# Patient Record
Sex: Male | Born: 1935 | Race: Black or African American | Hispanic: No | Marital: Married | Smoking: Current some day smoker
Health system: Southern US, Community
[De-identification: ages and names within clinical notes are randomized; demographics above are authoritative.]

## PROBLEM LIST (undated history)

## (undated) DIAGNOSIS — G4700 Insomnia, unspecified: Secondary | ICD-10-CM

## (undated) DIAGNOSIS — D649 Anemia, unspecified: Secondary | ICD-10-CM

## (undated) DIAGNOSIS — I10 Essential (primary) hypertension: Secondary | ICD-10-CM

## (undated) DIAGNOSIS — I639 Cerebral infarction, unspecified: Secondary | ICD-10-CM

## (undated) DIAGNOSIS — I2789 Other specified pulmonary heart diseases: Secondary | ICD-10-CM

## (undated) DIAGNOSIS — I739 Peripheral vascular disease, unspecified: Secondary | ICD-10-CM

## (undated) DIAGNOSIS — I714 Abdominal aortic aneurysm, without rupture, unspecified: Secondary | ICD-10-CM

## (undated) DIAGNOSIS — I723 Aneurysm of iliac artery: Secondary | ICD-10-CM

## (undated) DIAGNOSIS — I2510 Atherosclerotic heart disease of native coronary artery without angina pectoris: Secondary | ICD-10-CM

## (undated) DIAGNOSIS — R791 Abnormal coagulation profile: Secondary | ICD-10-CM

## (undated) DIAGNOSIS — I6529 Occlusion and stenosis of unspecified carotid artery: Secondary | ICD-10-CM

## (undated) DIAGNOSIS — E785 Hyperlipidemia, unspecified: Secondary | ICD-10-CM

## (undated) HISTORY — DX: Peripheral vascular disease, unspecified: I73.9

## (undated) HISTORY — DX: Hyperlipidemia, unspecified: E78.5

## (undated) HISTORY — DX: Cerebral infarction, unspecified: I63.9

## (undated) HISTORY — DX: Aneurysm of iliac artery: I72.3

## (undated) HISTORY — DX: Occlusion and stenosis of unspecified carotid artery: I65.29

## (undated) HISTORY — DX: Abdominal aortic aneurysm, without rupture: I71.4

## (undated) HISTORY — DX: Abdominal aortic aneurysm, without rupture, unspecified: I71.40

## (undated) HISTORY — DX: Abnormal coagulation profile: R79.1

## (undated) HISTORY — DX: Anemia, unspecified: D64.9

## (undated) HISTORY — DX: Insomnia, unspecified: G47.00

## (undated) HISTORY — DX: Other specified pulmonary heart diseases: I27.89

---

## 2009-08-05 ENCOUNTER — Emergency Department (HOSPITAL_BASED_OUTPATIENT_CLINIC_OR_DEPARTMENT_OTHER): Admission: EM | Admit: 2009-08-05 | Discharge: 2009-08-05 | Payer: Self-pay | Admitting: Emergency Medicine

## 2009-08-05 ENCOUNTER — Ambulatory Visit: Payer: Self-pay | Admitting: Radiology

## 2011-03-06 LAB — DIFFERENTIAL
Basophils Absolute: 0.1 10*3/uL (ref 0.0–0.1)
Basophils Relative: 1 % (ref 0–1)
Eosinophils Relative: 2 % (ref 0–5)
Lymphocytes Relative: 48 % — ABNORMAL HIGH (ref 12–46)
Neutro Abs: 1.7 10*3/uL (ref 1.7–7.7)

## 2011-03-06 LAB — CBC
HCT: 41.2 % (ref 39.0–52.0)
MCHC: 33.8 g/dL (ref 30.0–36.0)
Platelets: 327 10*3/uL (ref 150–400)
RDW: 13.8 % (ref 11.5–15.5)

## 2011-03-06 LAB — BASIC METABOLIC PANEL
BUN: 12 mg/dL (ref 6–23)
Calcium: 9.1 mg/dL (ref 8.4–10.5)
GFR calc non Af Amer: >60 mL/min (ref >60)
Glucose, Bld: 77 mg/dL (ref 70–99)

## 2011-03-06 LAB — URINALYSIS, ROUTINE W REFLEX MICROSCOPIC
Bilirubin Urine: NEGATIVE
Ketones, ur: NEGATIVE mg/dL
Nitrite: NEGATIVE
Protein, ur: NEGATIVE mg/dL
Specific Gravity, Urine: 1.012 (ref 1.005–1.030)
Urobilinogen, UA: 1 mg/dL (ref 0.0–1.0)

## 2011-03-06 LAB — SEDIMENTATION RATE: Sed Rate: 1 mm/hr (ref 0–16)

## 2011-06-24 ENCOUNTER — Emergency Department (HOSPITAL_BASED_OUTPATIENT_CLINIC_OR_DEPARTMENT_OTHER)
Admission: EM | Admit: 2011-06-24 | Discharge: 2011-06-24 | Disposition: A | Discharge status: Home / Self Care | Payer: Medicare Other | Attending: Emergency Medicine | Admitting: Emergency Medicine

## 2011-06-24 ENCOUNTER — Encounter: Payer: Self-pay | Admitting: *Deleted

## 2011-06-24 DIAGNOSIS — S61209A Unspecified open wound of unspecified finger without damage to nail, initial encounter: Secondary | ICD-10-CM | POA: Insufficient documentation

## 2011-06-24 DIAGNOSIS — Z8679 Personal history of other diseases of the circulatory system: Secondary | ICD-10-CM | POA: Insufficient documentation

## 2011-06-24 DIAGNOSIS — F17200 Nicotine dependence, unspecified, uncomplicated: Secondary | ICD-10-CM | POA: Insufficient documentation

## 2011-06-24 DIAGNOSIS — I10 Essential (primary) hypertension: Secondary | ICD-10-CM | POA: Insufficient documentation

## 2011-06-24 DIAGNOSIS — W268XXA Contact with other sharp object(s), not elsewhere classified, initial encounter: Secondary | ICD-10-CM | POA: Insufficient documentation

## 2011-06-24 DIAGNOSIS — S61219A Laceration without foreign body of unspecified finger without damage to nail, initial encounter: Secondary | ICD-10-CM

## 2011-06-24 DIAGNOSIS — I2510 Atherosclerotic heart disease of native coronary artery without angina pectoris: Secondary | ICD-10-CM | POA: Insufficient documentation

## 2011-06-24 DIAGNOSIS — Z79899 Other long term (current) drug therapy: Secondary | ICD-10-CM | POA: Insufficient documentation

## 2011-06-24 HISTORY — DX: Essential (primary) hypertension: I10

## 2011-06-24 HISTORY — DX: Atherosclerotic heart disease of native coronary artery without angina pectoris: I25.10

## 2011-06-24 LAB — PROTIME-INR
INR: 1.03 (ref 0.00–1.49)
Prothrombin Time: 13.7 seconds (ref 11.6–15.2)

## 2011-06-24 LAB — DIFFERENTIAL
Basophils Absolute: 0.1 10*3/uL (ref 0.0–0.1)
Basophils Relative: 2 % — ABNORMAL HIGH (ref 0–1)
Eosinophils Absolute: 0.1 10*3/uL (ref 0.0–0.7)
Eosinophils Relative: 2 % (ref 0–5)
Neutrophils Relative %: 32 % — ABNORMAL LOW (ref 43–77)

## 2011-06-24 LAB — CBC
MCH: 24.9 pg — ABNORMAL LOW (ref 26.0–34.0)
MCHC: 34.1 g/dL (ref 30.0–36.0)
MCV: 73 fL — ABNORMAL LOW (ref 78.0–100.0)
Platelets: 425 10*3/uL — ABNORMAL HIGH (ref 150–400)
RBC: 5.07 MIL/uL (ref 4.22–5.81)
RDW: 16.1 % — ABNORMAL HIGH (ref 11.5–15.5)

## 2011-06-24 NOTE — ED Notes (Signed)
Pt c/o laceration to right ring finger this am by metal.

## 2011-06-24 NOTE — ED Provider Notes (Signed)
History     Chief Complaint  Patient presents with  . Laceration   HPI Comments: Lacerated finger on piece of metal this morning.  Has been unable to get it to stop bleeding.  Patient is unsure as to whether or not he is on coumadin.  It is not on his med list but the wife believes he is.    Patient is a 75 y.o. male presenting with skin laceration. The history is provided by the patient and the spouse.  Laceration  The incident occurred 6 to 12 hours ago. The laceration is located on the right hand. The laceration is 1 cm in size. The laceration mechanism was a a metal edge. The pain is at a severity of 0/10. The patient is experiencing no pain. He reports no foreign bodies present.    Past Medical History  Diagnosis Date  . CVA (cerebral infarction)   . Coronary artery disease   . Hypertension     History reviewed. No pertinent past surgical history.  History reviewed. No pertinent family history.  History  Substance Use Topics  . Smoking status: Current Everyday Smoker  . Smokeless tobacco: Not on file  . Alcohol Use: 1.0 oz/week    2 drink(s) per week      Review of Systems  Constitutional: Negative.   Musculoskeletal:       Laceration as above.  Hematological:       Bleeding won't stop.    Physical Exam  BP 176/76  Pulse 70  Temp(Src) 98.7 F (37.1 C) (Oral)  Resp 16  Wt 209 lb (94.802 kg)  SpO2 100%  Physical Exam  Constitutional: He appears well-developed and well-nourished. No distress.  HENT:  Head: Normocephalic and atraumatic.  Musculoskeletal:       The right middle finger is noted to have a 1.5 cm laceration situated length-wise on the dip joint.  There is arterial bleeding noted.    Skin: He is not diaphoretic.    ED Course  LACERATION REPAIR Date/Time: 06/24/2011 6:19 PM Performed by: Geoffery Lyons Authorized by: Geoffery Lyons Consent: Verbal consent obtained. Risks and benefits: risks, benefits and alternatives were discussed Consent  given by: patient Patient understanding: patient states understanding of the procedure being performed Patient consent: the patient's understanding of the procedure matches consent given Procedure consent: procedure consent matches procedure scheduled Relevant documents: relevant documents present and verified Test results: test results available and properly labeled Site marked: the operative site was marked Imaging studies: imaging studies available Required items: required blood products, implants, devices, and special equipment available Patient identity confirmed: verbally with patient Time out: Immediately prior to procedure a "time out" was called to verify the correct patient, procedure, equipment, support staff and site/side marked as required. Location: right middle finger. Laceration length: 1.5 cm Foreign bodies: no foreign bodies Tendon involvement: none Nerve involvement: none Vascular damage: arterial bleeding. Anesthesia: local infiltration Local anesthetic: lidocaine 2% without epinephrine Anesthetic total: 1 ml Patient sedated: yes Preparation: Patient was prepped and draped in the usual sterile fashion. Irrigation solution: saline Amount of cleaning: standard Debridement: none Degree of undermining: none Skin closure: 4-0 Prolene Number of sutures: 3 Technique: simple Approximation: close Approximation difficulty: simple Dressing: antibiotic ointment and tube gauze Patient tolerance: Patient tolerated the procedure well with no immediate complications.    MDM Laceration sutured, coags okay, bleeding controlled.        Riley Lam [facility] 06/24/11 1927

## 2011-07-02 ENCOUNTER — Emergency Department (HOSPITAL_BASED_OUTPATIENT_CLINIC_OR_DEPARTMENT_OTHER)
Admission: EM | Admit: 2011-07-02 | Discharge: 2011-07-02 | Disposition: A | Discharge status: Home / Self Care | Payer: Medicare Other | Attending: Emergency Medicine | Admitting: Emergency Medicine

## 2011-07-02 ENCOUNTER — Encounter (HOSPITAL_BASED_OUTPATIENT_CLINIC_OR_DEPARTMENT_OTHER): Payer: Self-pay | Admitting: *Deleted

## 2011-07-02 DIAGNOSIS — IMO0002 Reserved for concepts with insufficient information to code with codable children: Secondary | ICD-10-CM

## 2011-07-02 DIAGNOSIS — I2510 Atherosclerotic heart disease of native coronary artery without angina pectoris: Secondary | ICD-10-CM | POA: Insufficient documentation

## 2011-07-02 DIAGNOSIS — I10 Essential (primary) hypertension: Secondary | ICD-10-CM | POA: Insufficient documentation

## 2011-07-02 DIAGNOSIS — Z4802 Encounter for removal of sutures: Secondary | ICD-10-CM | POA: Insufficient documentation

## 2011-07-02 DIAGNOSIS — Z8679 Personal history of other diseases of the circulatory system: Secondary | ICD-10-CM | POA: Insufficient documentation

## 2011-07-02 NOTE — ED Provider Notes (Signed)
History     CSN: [id number] Arrival date & time: 07/02/2011 10:00 AM  Chief Complaint  Patient presents with  . Suture / Staple Removal   HPI Comments: Patient is without complaints. He is here for suture removal.  Patient is a 75 y.o. male presenting with suture removal.  Suture / Staple Removal  The sutures were placed 7 to 10 days ago. There has been no treatment since the wound repair. There has been no drainage from the wound. There is no redness present. There is no swelling present. The pain has no pain.    Past Medical History  Diagnosis Date  . CVA (cerebral infarction)   . Coronary artery disease   . Hypertension     History reviewed. No pertinent past surgical history.  No family history on file.  History  Substance Use Topics  . Smoking status: Current Everyday Smoker  . Smokeless tobacco: Not on file  . Alcohol Use: 1.0 oz/week    2 drink(s) per week      Review of Systems  Constitutional: Negative for fever.  Musculoskeletal:       No swelling or pain at the wound site    Physical Exam  BP 137/71  Pulse 68  Temp(Src) 97.9 F (36.6 C) (Oral)  Resp 14  SpO2 99%  Physical Exam  Constitutional: He appears well-developed and well-nourished. No distress.  HENT:  Head: Normocephalic and atraumatic.  Right Ear: External ear normal.  Left Ear: External ear normal.  Eyes: Conjunctivae are normal. Right eye exhibits no discharge. Left eye exhibits no discharge. No scleral icterus.  Neck: Neck supple. No tracheal deviation present.  Pulmonary/Chest: Effort normal. No stridor. No respiratory distress.  Musculoskeletal: He exhibits no edema.       Right ring finger distal aspect 3 sutures noted without erythema or exudate  Neurological: He is alert. Cranial nerve deficit: no gross deficits.  Skin: Skin is warm and dry. No rash noted.  Psychiatric: He has a normal mood and affect.    ED Course  Procedures 3 sutures are removed without  difficulty MDM Suture removal without complication      Celene Kras, MD 07/02/11 1014

## 2011-07-02 NOTE — ED Notes (Signed)
Pt reports received 3-4 stitches to right ring finger x 1 week ago. Needs sutures removed. No redness, drainage, swelling noted.

## 2012-05-11 ENCOUNTER — Encounter: Payer: Self-pay | Admitting: Vascular Surgery

## 2012-05-12 ENCOUNTER — Ambulatory Visit (INDEPENDENT_AMBULATORY_CARE_PROVIDER_SITE_OTHER): Payer: Medicare Other | Admitting: Vascular Surgery

## 2012-05-12 ENCOUNTER — Encounter: Payer: Self-pay | Admitting: Vascular Surgery

## 2012-05-12 VITALS — BP 160/80 | HR 67 | Resp 20 | Ht 68.0 in | Wt 207.0 lb

## 2012-05-12 DIAGNOSIS — I714 Abdominal aortic aneurysm, without rupture, unspecified: Secondary | ICD-10-CM

## 2012-05-12 DIAGNOSIS — I723 Aneurysm of iliac artery: Secondary | ICD-10-CM

## 2012-05-12 NOTE — Progress Notes (Signed)
[facility]  Vascular Consult Note    Patient name: Steven Mejia MRN: [id number] DOB: Sep 25, 1936 Sex: male   Referred by: Kathrynn Speed  Reason for referral: Abdominal and bilateral iliac arteries aneurysm  HISTORY OF PRESENT ILLNESS: The patient is a 76 year old gentleman who was found to have aneurysmal disease by screening ultrasound. He subsequently underwent CT scan for further evaluation and I have his report and reviewed his actual films from his CT scan. He has no symptoms referable to this and does not have any known family history of aneurysmal disease. He does have extensive past medical history as outlined below with specifically history of coronary artery disease hypertension. He does also have a history of mild peripheral vascular occlusive disease his at does have non-insulin-dependent diabetes as well.  Past Medical History  Diagnosis Date  . Coronary artery disease   . Hypertension   . Stroke   . Abnormal coagulation profile   . Insomnia, unspecified   . Other chronic pulmonary heart diseases     Pulmonary hypertension  . Anemia     iron Deficiency  . Peripheral arterial disease   . Diabetes mellitus   . Hyperlipidemia   . Carotid artery occlusion   . AAA (abdominal aortic aneurysm)   . Iliac artery aneurysm, left     No past surgical history on file.  History   Social History  . Marital Status: Married    Spouse Name: N/A    Number of Children: N/A  . Years of Education: N/A   Occupational History  . Not on file.   Social History Main Topics  . Smoking status: Current Everyday Smoker -- 50 years    Types: Cigarettes  . Smokeless tobacco: Never Used  . Alcohol Use: 1.0 oz/week    2 drink(s) per week  . Drug Use: No  . Sexually Active: No   Other Topics Concern  . Not on file   Social History Narrative  . No narrative on file    Family History  Problem Relation Age of Onset  . Heart disease Brother   .  Hyperlipidemia Brother   . Hypertension Brother   . Hypertension Mother   . Heart disease Mother   . Diabetes Mother   . Aneurysm Mother     BRAIN    No Known Allergies  Prior to Admission medications   Medication Sig Start Date End Date Taking? Authorizing Provider  amLODipine (NORVASC) 10 MG tablet Take 10 mg by mouth daily.     Yes Historical Provider, MD  aspirin EC 81 MG tablet Take 81 mg by mouth daily.     Yes Historical Provider, MD  atorvastatin (LIPITOR) 80 MG tablet Take 80 mg by mouth daily.   Yes Historical Provider, MD  carvedilol (COREG) 6.25 MG tablet Take 6.25 mg by mouth 2 (two) times daily with a meal.     Yes Historical Provider, MD  cloNIDine (CATAPRES) 0.2 MG tablet Take 0.2 mg by mouth daily.   Yes Historical Provider, MD  donepezil (ARICEPT) 10 MG tablet Take 10 mg by mouth at bedtime.     Yes Historical Provider, MD  ergocalciferol (VITAMIN D2) 50000 UNITS capsule Take 50,000 Units by mouth once a week. Take on Saturday    Yes Historical Provider, MD  ferrous sulfate 325 (65 FE) MG tablet Take 325 mg by mouth daily with breakfast.     Yes Historical Provider, MD  folic acid (FOLVITE) 1 MG tablet Take  1 mg by mouth daily.     Yes Historical Provider, MD  hydrALAZINE (APRESOLINE) 25 MG tablet Take 25 mg by mouth 3 (three) times daily. Blood pressure    Yes Historical Provider, MD  irbesartan (AVAPRO) 300 MG tablet Take 300 mg by mouth at bedtime.     Yes Historical Provider, MD  prasugrel (EFFIENT) 10 MG TABS Take 10 mg by mouth daily.     Yes Historical Provider, MD  sildenafil (VIAGRA) 100 MG tablet Take 100 mg by mouth as needed.     Yes Historical Provider, MD  naphazoline (CLEAR EYES) 0.012 % ophthalmic solution Place 2 drops into both eyes 2 (two) times daily. Red itchy eyes     Historical Provider, MD     REVIEW OF SYSTEMS: Cardiovascular: No chest pain, chest pressure, palpitations, orthopnea, he does have dyspnea on exertion. No claudication or rest  pain,  No history of DVT or phlebitis. Pulmonary: No productive cough, asthma or wheezing. Neurologic: No weakness, paresthesias, aphasia, or amaurosis. No dizziness. Hematologic: No bleeding problems or clotting disorders. He does report some sensation of increased bleeding and cut. Musculoskeletal: No joint pain or joint swelling. Gastrointestinal: No blood in stool or hematemesis Genitourinary: No dysuria or hematuria. Psychiatric:: No history of major depression. Integumentary: No rashes or ulcers. Constitutional: No fever or chills.  PHYSICAL EXAMINATION:  Filed Vitals:   05/12/12 1000  BP: 160/80  Pulse: 67  Resp: 20    General: The patient appears their stated age. Pulmonary: There is a good air exchange bilaterally without wheezing or rales. Abdomen: Soft and non-tender with normal pitch bowel sounds. Moderate obesity I do not palpate an aneurysm Musculoskeletal: There are no major deformities.  There is no significant extremity pain. Neurologic: No focal weakness or paresthesias are detected, Skin: There are no ulcer or rashes noted. Psychiatric: The patient has normal affect. Cardiovascular: There is a regular rate and rhythm without significant murmur appreciated. Carotids without bruits bilaterally Pulse status 2+ radial 2+ femoral 2+ popliteal pulses. In the outpatient pedal pulses bilaterally    Outside Studies/Documentation Historical records were reviewed.  They showed CT scan reveals a 3.1 cm infrarenal abdominal aortic aneurysm and ectasia of his bilateral iliac arteries. This is 1.8 cm on the right and 2.7 cm on the left  Medication Changes: None  Assessment:  Small abdominal and moderate iliac artery aneurysms  Plan: Had a long discussion with the patient and his wife present. I explained there would be no restrictions as far as activity and the only thing we would require is a standard blood pressure control. He can lift and strain without any  restrictions. I did explain that he has a really no concern regarding his aortic aneurysm but he does have moderate ectasia in aneurysmal change in his iliac arteries. I feel that he is at minimal risk around her currently would not recommend elective repair. We will see him again in 6 months with repeat ultrasound for continued monitoring of this. I did describe symptoms of aneurysm with the patient and his wife and report immediately to emerge from should this occur I explained at this should be very slight risk  Steven Mejia 6/13/201311:18 AM

## 2012-05-12 NOTE — Addendum Note (Signed)
Addended by: Sharee Pimple on: 05/12/2012 12:16 PM   Modules accepted: Orders

## 2012-05-18 ENCOUNTER — Encounter: Payer: Medicare Other | Admitting: Vascular Surgery

## 2012-11-10 ENCOUNTER — Other Ambulatory Visit: Payer: Self-pay | Admitting: *Deleted

## 2012-11-10 ENCOUNTER — Encounter: Payer: Self-pay | Admitting: Neurosurgery

## 2012-11-10 DIAGNOSIS — Z48812 Encounter for surgical aftercare following surgery on the circulatory system: Secondary | ICD-10-CM

## 2012-11-10 DIAGNOSIS — I739 Peripheral vascular disease, unspecified: Secondary | ICD-10-CM

## 2012-11-11 ENCOUNTER — Encounter (INDEPENDENT_AMBULATORY_CARE_PROVIDER_SITE_OTHER): Payer: Medicare Other | Admitting: *Deleted

## 2012-11-11 ENCOUNTER — Encounter: Payer: Self-pay | Admitting: Neurosurgery

## 2012-11-11 ENCOUNTER — Ambulatory Visit (INDEPENDENT_AMBULATORY_CARE_PROVIDER_SITE_OTHER): Payer: Medicare Other | Admitting: Neurosurgery

## 2012-11-11 VITALS — BP 148/82 | HR 68 | Resp 16 | Ht 68.0 in | Wt 205.0 lb

## 2012-11-11 DIAGNOSIS — I714 Abdominal aortic aneurysm, without rupture, unspecified: Secondary | ICD-10-CM | POA: Insufficient documentation

## 2012-11-11 DIAGNOSIS — I723 Aneurysm of iliac artery: Secondary | ICD-10-CM

## 2012-11-11 NOTE — Addendum Note (Signed)
Addended by: Sharee Pimple on: 11/11/2012 03:58 PM   Modules accepted: Orders

## 2012-11-11 NOTE — Progress Notes (Signed)
[facility] [place] AAA/Carotid Office Note  CC: AAA surveillance Referring Physician: Early  History of Present Illness: 76 year old male patient of Dr. Arbie Cookey who followed for small known AAA. The patient denies unusual abdominal or back pain. The patient also denies any new medical diagnoses recent surgery.  Past Medical History  Diagnosis Date  . Coronary artery disease   . Hypertension   . Stroke   . Abnormal coagulation profile   . Insomnia, unspecified   . Other chronic pulmonary heart diseases     Pulmonary hypertension  . Anemia     iron Deficiency  . Peripheral arterial disease   . Hyperlipidemia   . Carotid artery occlusion   . AAA (abdominal aortic aneurysm)   . Iliac artery aneurysm, left     ROS: [x]  Positive   [ ]  Denies    General: [ ]  Weight loss, [ ]  Fever, [ ]  chills Neurologic: [ ]  Dizziness, [ ]  Blackouts, [ ]  Seizure [ ]  Stroke, [ ]  "Mini stroke", [ ]  Slurred speech, [ ]  Temporary blindness; [ ]  weakness in arms or legs, [ ]  Hoarseness Cardiac: [ ]  Chest pain/pressure, [ ]  Shortness of breath at rest [ ]  Shortness of breath with exertion, [ ]  Atrial fibrillation or irregular heartbeat Vascular: [ ]  Pain in legs with walking, [ ]  Pain in legs at rest, [ ]  Pain in legs at night,  [ ]  Non-healing ulcer, [ ]  Blood clot in vein/DVT,   Pulmonary: [ ]  Home oxygen, [ ]  Productive cough, [ ]  Coughing up blood, [ ]  Asthma,  [ ]  Wheezing Musculoskeletal:  [ ]  Arthritis, [ ]  Low back pain, [ ]  Joint pain Hematologic: [ ]  Easy Bruising, [ ]  Anemia; [ ]  Hepatitis Gastrointestinal: [ ]  Blood in stool, [ ]  Gastroesophageal Reflux/heartburn, [ ]  Trouble swallowing Urinary: [ ]  chronic Kidney disease, [ ]  on HD - [ ]  MWF or [ ]  TTHS, [ ]  Burning with urination, [ ]  Difficulty urinating Skin: [ ]  Rashes, [ ]  Wounds Psychological: [ ]  Anxiety, [ ]  Depression   Social History History  Substance Use Topics  . Smoking status: Current Every Day Smoker  -- 50 years    Types: Cigarettes  . Smokeless tobacco: Never Used  . Alcohol Use: 1.0 oz/week    2 drink(s) per week    Family History Family History  Problem Relation Age of Onset  . Heart disease Brother   . Hyperlipidemia Brother   . Hypertension Brother   . Hypertension Mother   . Heart disease Mother   . Diabetes Mother   . Aneurysm Mother     BRAIN    No Known Allergies  Current Outpatient Prescriptions  Medication Sig Dispense Refill  . amLODipine (NORVASC) 10 MG tablet Take 10 mg by mouth daily.        Marland Kitchen aspirin EC 81 MG tablet Take 81 mg by mouth daily.        Marland Kitchen atorvastatin (LIPITOR) 80 MG tablet Take 80 mg by mouth daily.      . carvedilol (COREG) 6.25 MG tablet Take 6.25 mg by mouth 2 (two) times daily with a meal.        . cloNIDine (CATAPRES) 0.2 MG tablet Take 0.2 mg by mouth daily.      Marland Kitchen donepezil (ARICEPT) 10 MG tablet Take 10 mg by mouth at bedtime.        . ergocalciferol (VITAMIN D2) 50000 UNITS capsule Take 50,000 Units by  mouth once a week. Take on Saturday       . ferrous sulfate 325 (65 FE) MG tablet Take 325 mg by mouth daily with breakfast.        . folic acid (FOLVITE) 1 MG tablet Take 1 mg by mouth daily.        . hydrALAZINE (APRESOLINE) 25 MG tablet Take 25 mg by mouth 3 (three) times daily. Blood pressure       . irbesartan (AVAPRO) 300 MG tablet Take 300 mg by mouth at bedtime.        . naphazoline (CLEAR EYES) 0.012 % ophthalmic solution Place 2 drops into both eyes 2 (two) times daily. Red itchy eyes       . prasugrel (EFFIENT) 10 MG TABS Take 10 mg by mouth daily.        . sildenafil (VIAGRA) 100 MG tablet Take 100 mg by mouth as needed.          Physical Examination  Filed Vitals:   11/11/12 0952  BP: 148/82  Pulse: 68  Resp: 16    Body mass index is 31.17 kg/(m^2).  General:  WDWN in NAD Gait: Normal HEENT: WNL Eyes: Pupils equal Pulmonary: normal non-labored breathing , without Rales, rhonchi,  wheezing Cardiac: RRR,  without  Murmurs, rubs or gallops; Abdomen: soft, NT, no masses Skin: no rashes, ulcers noted  Vascular Exam Pulses: 3+ radial pulses bilaterally, there is no pulsatile abdominal mass palpated Carotid bruits: Carotid pulses to auscultation no bruits are heard Extremities without ischemic changes, no Gangrene , no cellulitis; no open wounds;  Musculoskeletal: no muscle wasting or atrophy   Neurologic: A&O X 3; Appropriate Affect ; SENSATION: normal; MOTOR FUNCTION:  moving all extremities equally. Speech is fluent/normal  Non-Invasive Vascular Imaging Maximum AAA measurement per duplex today is 3.2 AP by 3.2 transverse which is virtually unchanged from prior CT. Left common iliac artery measures 2.6 cm x 2.6 cm   ASSESSMENT/PLAN: Asymptomatic patient with small AAA that will followup in one year with repeat duplex. The patient's questions were encouraged and answered, he is in agreement with this plan.  Lauree Chandler ANP   Clinic MD: Early

## 2013-04-22 ENCOUNTER — Encounter (HOSPITAL_BASED_OUTPATIENT_CLINIC_OR_DEPARTMENT_OTHER): Payer: Self-pay | Admitting: Emergency Medicine

## 2013-04-22 ENCOUNTER — Emergency Department (HOSPITAL_BASED_OUTPATIENT_CLINIC_OR_DEPARTMENT_OTHER)
Admission: EM | Admit: 2013-04-22 | Discharge: 2013-04-22 | Disposition: A | Discharge status: Home / Self Care | Payer: Medicare Other | Attending: Emergency Medicine | Admitting: Emergency Medicine

## 2013-04-22 DIAGNOSIS — Z8679 Personal history of other diseases of the circulatory system: Secondary | ICD-10-CM | POA: Insufficient documentation

## 2013-04-22 DIAGNOSIS — Z7982 Long term (current) use of aspirin: Secondary | ICD-10-CM | POA: Insufficient documentation

## 2013-04-22 DIAGNOSIS — E785 Hyperlipidemia, unspecified: Secondary | ICD-10-CM | POA: Insufficient documentation

## 2013-04-22 DIAGNOSIS — W2203XA Walked into furniture, initial encounter: Secondary | ICD-10-CM | POA: Insufficient documentation

## 2013-04-22 DIAGNOSIS — Z23 Encounter for immunization: Secondary | ICD-10-CM | POA: Insufficient documentation

## 2013-04-22 DIAGNOSIS — T148XXA Other injury of unspecified body region, initial encounter: Secondary | ICD-10-CM

## 2013-04-22 DIAGNOSIS — D509 Iron deficiency anemia, unspecified: Secondary | ICD-10-CM | POA: Insufficient documentation

## 2013-04-22 DIAGNOSIS — Z8669 Personal history of other diseases of the nervous system and sense organs: Secondary | ICD-10-CM | POA: Insufficient documentation

## 2013-04-22 DIAGNOSIS — IMO0002 Reserved for concepts with insufficient information to code with codable children: Secondary | ICD-10-CM | POA: Insufficient documentation

## 2013-04-22 DIAGNOSIS — F17200 Nicotine dependence, unspecified, uncomplicated: Secondary | ICD-10-CM | POA: Insufficient documentation

## 2013-04-22 DIAGNOSIS — Y9389 Activity, other specified: Secondary | ICD-10-CM | POA: Insufficient documentation

## 2013-04-22 DIAGNOSIS — Z79899 Other long term (current) drug therapy: Secondary | ICD-10-CM | POA: Insufficient documentation

## 2013-04-22 DIAGNOSIS — I2510 Atherosclerotic heart disease of native coronary artery without angina pectoris: Secondary | ICD-10-CM | POA: Insufficient documentation

## 2013-04-22 DIAGNOSIS — Y9289 Other specified places as the place of occurrence of the external cause: Secondary | ICD-10-CM | POA: Insufficient documentation

## 2013-04-22 DIAGNOSIS — I739 Peripheral vascular disease, unspecified: Secondary | ICD-10-CM | POA: Insufficient documentation

## 2013-04-22 DIAGNOSIS — I10 Essential (primary) hypertension: Secondary | ICD-10-CM | POA: Insufficient documentation

## 2013-04-22 DIAGNOSIS — Z8673 Personal history of transient ischemic attack (TIA), and cerebral infarction without residual deficits: Secondary | ICD-10-CM | POA: Insufficient documentation

## 2013-04-22 MED ORDER — TETANUS-DIPHTH-ACELL PERTUSSIS 5-2.5-18.5 LF-MCG/0.5 IM SUSP
0.5000 mL | Freq: Once | INTRAMUSCULAR | Status: AC
  Administered 2013-04-22: 0.5 mL via INTRAMUSCULAR
  Filled 2013-04-22: qty 0.5

## 2013-04-22 NOTE — ED Provider Notes (Signed)
History     CSN: [id number]  Arrival date & time 04/22/13  4098   First MD Initiated Contact with Patient 04/22/13 0800      Chief Complaint  Patient presents with  . Laceration    (Consider location/radiation/quality/duration/timing/severity/associated sxs/prior treatment) HPI Comments: PT presents with abrasion to right thumb.  This happened yesterday morning.  He states he scratched it on a dresser. He is on Effient and has noted some ongoing oozing from the wound since yesterday. He's not sure when his last tetanus shot was. He complains of very little pain to the area.  Patient is a 77 y.o. male presenting with skin laceration.  Laceration   Past Medical History  Diagnosis Date  . Coronary artery disease   . Hypertension   . Stroke   . Abnormal coagulation profile   . Insomnia, unspecified   . Other chronic pulmonary heart diseases     Pulmonary hypertension  . Anemia     iron Deficiency  . Peripheral arterial disease   . Hyperlipidemia   . Carotid artery occlusion   . AAA (abdominal aortic aneurysm)   . Iliac artery aneurysm, left     History reviewed. No pertinent past surgical history.  Family History  Problem Relation Age of Onset  . Heart disease Brother   . Hyperlipidemia Brother   . Hypertension Brother   . Hypertension Mother   . Heart disease Mother   . Diabetes Mother   . Aneurysm Mother     BRAIN    History  Substance Use Topics  . Smoking status: Current Every Day Smoker -- 50 years    Types: Cigarettes  . Smokeless tobacco: Never Used  . Alcohol Use: 1.0 oz/week    2 drink(s) per week      Review of Systems  Constitutional: Negative for fever.  Gastrointestinal: Negative for nausea and vomiting.  Musculoskeletal: Negative for back pain, joint swelling and arthralgias.  Skin: Positive for wound.  Neurological: Negative for weakness, numbness and headaches.    Allergies  Review of patient's allergies indicates no known  allergies.  Home Medications   Current Outpatient Rx  Name  Route  Sig  Dispense  Refill  . amLODipine (NORVASC) 10 MG tablet   Oral   Take 10 mg by mouth daily.           Marland Kitchen aspirin EC 81 MG tablet   Oral   Take 81 mg by mouth daily.           Marland Kitchen atorvastatin (LIPITOR) 80 MG tablet   Oral   Take 80 mg by mouth daily.         . carvedilol (COREG) 6.25 MG tablet   Oral   Take 6.25 mg by mouth 2 (two) times daily with a meal.           . cloNIDine (CATAPRES) 0.2 MG tablet   Oral   Take 0.2 mg by mouth daily.         Marland Kitchen donepezil (ARICEPT) 10 MG tablet   Oral   Take 10 mg by mouth at bedtime.           . ergocalciferol (VITAMIN D2) 50000 UNITS capsule   Oral   Take 50,000 Units by mouth once a week. Take on Saturday          . ferrous sulfate 325 (65 FE) MG tablet   Oral   Take 325 mg by mouth daily with breakfast.           .  folic acid (FOLVITE) 1 MG tablet   Oral   Take 1 mg by mouth daily.           . hydrALAZINE (APRESOLINE) 25 MG tablet   Oral   Take 25 mg by mouth 3 (three) times daily. Blood pressure          . irbesartan (AVAPRO) 300 MG tablet   Oral   Take 300 mg by mouth at bedtime.           . naphazoline (CLEAR EYES) 0.012 % ophthalmic solution   Both Eyes   Place 2 drops into both eyes 2 (two) times daily. Red itchy eyes          . prasugrel (EFFIENT) 10 MG TABS   Oral   Take 10 mg by mouth daily.           . sildenafil (VIAGRA) 100 MG tablet   Oral   Take 100 mg by mouth as needed.             BP 157/79  Pulse 80  Temp(Src) 98.4 F (36.9 C) (Oral)  Resp 16  Ht 5\' 8"  (1.727 m)  Wt 210 lb (95.255 kg)  BMI 31.94 kg/m2  SpO2 99%  Physical Exam  Constitutional: He is oriented to person, place, and time. He appears well-developed and well-nourished.  HENT:  Head: Normocephalic and atraumatic.  Neck: Normal range of motion. Neck supple.  Cardiovascular: Normal rate.   Pulmonary/Chest: Effort normal.   Musculoskeletal: He exhibits no edema and no tenderness.  Neurological: He is alert and oriented to person, place, and time.  Skin: Skin is warm and dry.  1cm abrasion over right 1st MCP joint.  Mild oozing from sight.  No bony tenderness.  NV intact  Psychiatric: He has a normal mood and affect.    ED Course  Procedures (including critical care time)  Labs Reviewed - No data to display No results found.   1. Abrasion       MDM  A dressing was applied with a hemostatic agent. The area was observed for a period time and there is no ongoing bleeding. Advised patient to leave the dressing on for 24 hours and then to clean the wound and change dressing once a day. I advised him to return here as needed for any ongoing bleeding or signs of infection. His tetanus shot was updated.        Rolan Bucco, MD 04/22/13 [phone]

## 2013-04-22 NOTE — ED Notes (Signed)
Laceration to right thumb occurred yesterday while closing a dresser drawer.  Pt has small area where skin has been removed.

## 2013-10-11 ENCOUNTER — Other Ambulatory Visit: Payer: Self-pay | Admitting: Neurosurgery

## 2013-10-11 DIAGNOSIS — I723 Aneurysm of iliac artery: Secondary | ICD-10-CM

## 2013-10-11 DIAGNOSIS — I714 Abdominal aortic aneurysm, without rupture: Secondary | ICD-10-CM

## 2013-11-09 ENCOUNTER — Encounter: Payer: Self-pay | Admitting: Family

## 2013-11-10 ENCOUNTER — Other Ambulatory Visit (HOSPITAL_COMMUNITY): Payer: Medicare Other

## 2013-11-10 ENCOUNTER — Ambulatory Visit (INDEPENDENT_AMBULATORY_CARE_PROVIDER_SITE_OTHER): Payer: Medicare Other | Admitting: Family

## 2013-11-10 ENCOUNTER — Ambulatory Visit (HOSPITAL_COMMUNITY)
Admission: RE | Admit: 2013-11-10 | Discharge: 2013-11-10 | Disposition: A | Discharge status: Home / Self Care | Payer: Medicare Other | Source: Ambulatory Visit | Attending: Family | Admitting: Family

## 2013-11-10 ENCOUNTER — Encounter: Payer: Self-pay | Admitting: Family

## 2013-11-10 VITALS — BP 130/73 | HR 60 | Resp 14 | Ht 68.0 in | Wt 209.0 lb

## 2013-11-10 DIAGNOSIS — I714 Abdominal aortic aneurysm, without rupture, unspecified: Secondary | ICD-10-CM | POA: Insufficient documentation

## 2013-11-10 DIAGNOSIS — I723 Aneurysm of iliac artery: Secondary | ICD-10-CM | POA: Insufficient documentation

## 2013-11-10 NOTE — Patient Instructions (Addendum)
Abdominal Aortic Aneurysm An aneurysm is a weakened or damaged part of an artery wall that bulges from the normal force of blood pumping through the body. An abdominal aortic aneurysm is an aneurysm that occurs in the lower part of the aorta, the main artery of the body.  The major concern with an abdominal aortic aneurysm is that it can enlarge and burst (rupture) or blood can flow between the layers of the wall of the aorta through a tear (aorticdissection). Both of these conditions can cause bleeding inside the body and can be life threatening unless diagnosed and treated promptly. CAUSES  The exact cause of an abdominal aortic aneurysm is unknown. Some contributing factors are:   A hardening of the arteries caused by the buildup of fat and other substances in the lining of a blood vessel (arteriosclerosis).  Inflammation of the walls of an artery (arteritis).   Connective tissue diseases, such as Marfan syndrome.   Abdominal trauma.   An infection, such as syphilis or staphylococcus, in the wall of the aorta (infectious aortitis) caused by bacteria. RISK FACTORS  Risk factors that contribute to an abdominal aortic aneurysm may include:  Age older than 60 years.   High blood pressure (hypertension).  Male gender.  Ethnicity (white race).  Obesity.  Family history of aneurysm (first degree relatives only).  Tobacco use. PREVENTION  The following healthy lifestyle habits may help decrease your risk of abdominal aortic aneurysm:  Quitting smoking. Smoking can raise your blood pressure and cause arteriosclerosis.  Limiting or avoiding alcohol.  Keeping your blood pressure, blood sugar level, and cholesterol levels within normal limits.  Decreasing your salt intake. In somepeople, too much salt can raise blood pressure and increase your risk of abdominal aortic aneurysm.  Eating a diet low in saturated fats and cholesterol.  Increasing your fiber intake by including  whole grains, vegetables, and fruits in your diet. Eating these foods may help lower blood pressure.  Maintaining a healthy weight.  Staying physically active and exercising regularly. SYMPTOMS  The symptoms of abdominal aortic aneurysm may vary depending on the size and rate of growth of the aneurysm.Most grow slowly and do not have any symptoms. When symptoms do occur, they may include:  Pain (abdomen, side, lower back, or groin). The pain may vary in intensity. A sudden onset of severe pain may indicate that the aneurysm has ruptured.  Feeling full after eating only small amounts of food.  Nausea or vomiting or both.  Feeling a pulsating lump in the abdomen.  Feeling faint or passing out. DIAGNOSIS  Since most unruptured abdominal aortic aneurysms have no symptoms, they are often discovered during diagnostic exams for other conditions. An aneurysm may be found during the following procedures:  Ultrasonography (A one-time screening for abdominal aortic aneurysm by ultrasonography is also recommended for all men aged 65-75 years who have ever smoked).  X-ray exams.  A computed tomography (CT).  Magnetic resonance imaging (MRI).  Angiography or arteriography. TREATMENT  Treatment of an abdominal aortic aneurysm depends on the size of your aneurysm, your age, and risk factors for rupture. Medication to control blood pressure and pain may be used to manage aneurysms smaller than 6 cm. Regular monitoring for enlargement may be recommended by your caregiver if:  The aneurysm is 3 4 cm in size (an annual ultrasonography may be recommended).  The aneurysm is 4 4.5 cm in size (an ultrasonography every 6 months may be recommended).  The aneurysm is larger than 4.5   cm in size (your caregiver may ask that you be examined by a vascular surgeon). If your aneurysm is larger than 6 cm, surgical repair may be recommended. There are two main methods for repair of an aneurysm:   Endovascular  repair (a minimally invasive surgery). This is done most often.  Open repair. This method is used if an endovascular repair is not possible. Document Released: 08/26/2005 Document Revised: 03/13/2013 Document Reviewed: 12/16/2012 Exit[employer] Patient Information 2014 [employer].   Smoking Cessation Quitting smoking is important to your health and has many advantages. However, it is not always easy to quit since nicotine is a very addictive drug. Often times, people try 3 times or more before being able to quit. This document explains the best ways for you to prepare to quit smoking. Quitting takes hard work and a lot of effort, but you can do it. ADVANTAGES OF QUITTING SMOKING  You will live longer, feel better, and live better.  Your body will feel the impact of quitting smoking almost immediately.  Within 20 minutes, blood pressure decreases. Your pulse returns to its normal level.  After 8 hours, carbon monoxide levels in the blood return to normal. Your oxygen level increases.  After 24 hours, the chance of having a heart attack starts to decrease. Your breath, hair, and body stop smelling like smoke.  After 48 hours, damaged nerve endings begin to recover. Your sense of taste and smell improve.  After 72 hours, the body is virtually free of nicotine. Your bronchial tubes relax and breathing becomes easier.  After 2 to 12 weeks, lungs can hold more air. Exercise becomes easier and circulation improves.  The risk of having a heart attack, stroke, cancer, or lung disease is greatly reduced.  After 1 year, the risk of coronary heart disease is cut in half.  After 5 years, the risk of stroke falls to the same as a nonsmoker.  After 10 years, the risk of lung cancer is cut in half and the risk of other cancers decreases significantly.  After 15 years, the risk of coronary heart disease drops, usually to the level of a nonsmoker.  If you are pregnant, quitting smoking will improve  your chances of having a healthy baby.  The people you live with, especially any children, will be healthier.  You will have extra money to spend on things other than cigarettes. QUESTIONS TO THINK ABOUT BEFORE ATTEMPTING TO QUIT You may want to talk about your answers with your caregiver.  Why do you want to quit?  If you tried to quit in the past, what helped and what did not?  What will be the most difficult situations for you after you quit? How will you plan to handle them?  Who can help you through the tough times? Your family? Friends? A caregiver?  What pleasures do you get from smoking? What ways can you still get pleasure if you quit? Here are some questions to ask your caregiver:  How can you help me to be successful at quitting?  What medicine do you think would be best for me and how should I take it?  What should I do if I need more help?  What is smoking withdrawal like? How can I get information on withdrawal? GET READY  Set a quit date.  Change your environment by getting rid of all cigarettes, ashtrays, matches, and lighters in your home, car, or work. Do not let people smoke in your home.  Review your   past attempts to quit. Think about what worked and what did not. GET SUPPORT AND ENCOURAGEMENT You have a better chance of being successful if you have help. You can get support in many ways.  Tell your family, friends, and co-workers that you are going to quit and need their support. Ask them not to smoke around you.  Get individual, group, or telephone counseling and support. Programs are available at local hospitals and health centers. Call your local health department for information about programs in your area.  Spiritual beliefs and practices may help some smokers quit.  Download a "quit meter" on your computer to keep track of quit statistics, such as how long you have gone without smoking, cigarettes not smoked, and money saved.  Get a self-help  book about quitting smoking and staying off of tobacco. LEARN NEW SKILLS AND BEHAVIORS  Distract yourself from urges to smoke. Talk to someone, go for a walk, or occupy your time with a task.  Change your normal routine. Take a different route to work. Drink tea instead of coffee. Eat breakfast in a different place.  Reduce your stress. Take a hot bath, exercise, or read a book.  Plan something enjoyable to do every day. Reward yourself for not smoking.  Explore interactive web-based programs that specialize in helping you quit. GET MEDICINE AND USE IT CORRECTLY Medicines can help you stop smoking and decrease the urge to smoke. Combining medicine with the above behavioral methods and support can greatly increase your chances of successfully quitting smoking.  Nicotine replacement therapy helps deliver nicotine to your body without the negative effects and risks of smoking. Nicotine replacement therapy includes nicotine gum, lozenges, inhalers, nasal sprays, and skin patches. Some may be available over-the-counter and others require a prescription.  Antidepressant medicine helps people abstain from smoking, but how this works is unknown. This medicine is available by prescription.  Nicotinic receptor partial agonist medicine simulates the effect of nicotine in your brain. This medicine is available by prescription. Ask your caregiver for advice about which medicines to use and how to use them based on your health history. Your caregiver will tell you what side effects to look out for if you choose to be on a medicine or therapy. Carefully read the information on the package. Do not use any other product containing nicotine while using a nicotine replacement product.  RELAPSE OR DIFFICULT SITUATIONS Most relapses occur within the first 3 months after quitting. Do not be discouraged if you start smoking again. Remember, most people try several times before finally quitting. You may have symptoms  of withdrawal because your body is used to nicotine. You may crave cigarettes, be irritable, feel very hungry, cough often, get headaches, or have difficulty concentrating. The withdrawal symptoms are only temporary. They are strongest when you first quit, but they will go away within 10 14 days. To reduce the chances of relapse, try to:  Avoid drinking alcohol. Drinking lowers your chances of successfully quitting.  Reduce the amount of caffeine you consume. Once you quit smoking, the amount of caffeine in your body increases and can give you symptoms, such as a rapid heartbeat, sweating, and anxiety.  Avoid smokers because they can make you want to smoke.  Do not let weight gain distract you. Many smokers will gain weight when they quit, usually less than 10 pounds. Eat a healthy diet and stay active. You can always lose the weight gained after you quit.  Find ways to improve your   mood other than smoking. FOR MORE INFORMATION  www.smokefree.gov  Document Released: 11/10/2001 Document Revised: 05/17/2012 Document Reviewed: 02/25/2012 [employer] Patient Information 2014 [employer].  

## 2013-11-10 NOTE — Progress Notes (Signed)
[facility] [place]  Established Abdominal Aortic Aneurysm  History of Present Illness  Steven Mejia is a 77 y.o. (03/12/1936) male patient of Dr. Arbie Cookey who is followed for small known AAA. He presents with chief complaint: follow up for AAA.   Previous studies demonstrate an AAA, measuring 3.2 x 3.2 cm; at that time the left common iliac artery measured 2.6 cm x 2.6 cm. The patient does not have back or abdominal pain.    The patient reports claudication in both calves after walking a mile, relieved by rest, denies non-healing wounds.  He was started on Effient after his stroke about 3 years ago as manifested by left leg and arm numbness and weakness, slurred speech, confusion, denies monocular blindness; he was treated in an ED at that time with thrombolytic medication. He denies any further stroke or TIA symptoms. He states that his cardiologist is checking [place] of his neck and legs.  He denies history of carotid artery intervention. He denies steal symptoms in either arm.  He admits to more than 14 drinks/week.  Pt Diabetic: No Pt smoker: smoker  (1 cigarette/day x 60 yrs)  Past Medical History  Diagnosis Date  . Coronary artery disease   . Hypertension   . Stroke   . Abnormal coagulation profile   . Insomnia, unspecified   . Other chronic pulmonary heart diseases     Pulmonary hypertension  . Anemia     iron Deficiency  . Peripheral arterial disease   . Hyperlipidemia   . Carotid artery occlusion   . AAA (abdominal aortic aneurysm)   . Iliac artery aneurysm, left    No past surgical history on file. Social History History   Social History  . Marital Status: Married    Spouse Name: N/A    Number of Children: N/A  . Years of Education: N/A   Occupational History  . Not on file.   Social History Main Topics  . Smoking status: Current Every Day Smoker -- 50 years    Types: Cigarettes  . Smokeless tobacco: Never Used  . Alcohol Use: 1.0  oz/week    2 drink(s) per week  . Drug Use: No  . Sexual Activity: No   Other Topics Concern  . Not on file   Social History Narrative  . No narrative on file   Family History Family History  Problem Relation Age of Onset  . Heart disease Brother   . Hyperlipidemia Brother   . Hypertension Brother   . Hypertension Mother   . Heart disease Mother   . Diabetes Mother   . Aneurysm Mother     BRAIN    Current Outpatient Prescriptions on File Prior to Visit  Medication Sig Dispense Refill  . amLODipine (NORVASC) 10 MG tablet Take 10 mg by mouth daily.        Marland Kitchen aspirin EC 81 MG tablet Take 81 mg by mouth daily.        Marland Kitchen atorvastatin (LIPITOR) 80 MG tablet Take 80 mg by mouth daily.      . carvedilol (COREG) 6.25 MG tablet Take 6.25 mg by mouth 2 (two) times daily with a meal.        . cloNIDine (CATAPRES) 0.2 MG tablet Take 0.2 mg by mouth daily.      Marland Kitchen donepezil (ARICEPT) 10 MG tablet Take 10 mg by mouth at bedtime.        . ergocalciferol (VITAMIN D2) 50000 UNITS capsule Take 50,000 Units  by mouth once a week. Take on Saturday       . ferrous sulfate 325 (65 FE) MG tablet Take 325 mg by mouth daily with breakfast.        . folic acid (FOLVITE) 1 MG tablet Take 1 mg by mouth daily.        . hydrALAZINE (APRESOLINE) 25 MG tablet Take 25 mg by mouth 3 (three) times daily. Blood pressure       . irbesartan (AVAPRO) 300 MG tablet Take 300 mg by mouth at bedtime.        . naphazoline (CLEAR EYES) 0.012 % ophthalmic solution Place 2 drops into both eyes 2 (two) times daily. Red itchy eyes       . prasugrel (EFFIENT) 10 MG TABS Take 10 mg by mouth daily.        . sildenafil (VIAGRA) 100 MG tablet Take 100 mg by mouth as needed.         No current facility-administered medications on file prior to visit.   No Known Allergies  ROS: See HPI for pertinent positives and negatives.   Physical Examination  Filed Vitals:   11/10/13 1025  BP: 130/73  Pulse: 60  Resp: 14   Filed  Weights   11/10/13 1025  Weight: 209 lb (94.802 kg)   Body mass index is 31.79 kg/(m^2).  General: A&O x 3, WD, Obese.  Pulmonary: Sym exp, good air movt, CTAB, no rales, rhonchi, or wheezing.  Cardiac: RRR, Nl S1, S2.  Carotid Bruits Left Right   Negative Negative   Radial pulses: right is 2+, left is not palpable, left brachial pulse is 3+                          VASCULAR EXAM:                                                                                                         LE Pulses LEFT RIGHT       POPLITEAL  not palpable   not palpable     Gastrointestinal: soft, NTND, -G/R, - HSM, - masses, - CVAT B.  Musculoskeletal: M/S 5/5 throughout.  Neurologic: CN 2-12 intact, Pain and light touch intact in extremities, Motor exam as listed above.  Non-Invasive Vascular Imaging  AAA Duplex (11/10/2013)  Previous size: 3.2 cm (Date: 11/11/2012)  Current size:  3.3 cm (Date: 11/10/2013)  Medical Decision Making  The patient is a 77 y.o. male who presents with asymptomatic small AAA with no increase in size.  His modifiable  risk factors for aneurysmal growth are obesity, smoking, excessive ETOH consumption, dietary indiscretions, and inactivity; this was discussed with him and his wife.  Based on this patient's exam and diagnostic studies, the patient will follow up in 1 year  with the following studies: AAA.  The threshold for repair is AAA size > 5.5 cm, growth > 1 cm/yr, and symptomatic status.  I emphasized the importance of maximal medical management including strict control of blood pressure, blood  glucose, and lipid levels, antiplatelet agents, obtaining regular exercise, and cessation of smoking.   The patient was given information about AAA including signs, symptoms, treatment, and how to minimize the risk of enlargement and rupture of aneurysms.    The patient was advised to call 911 should the patient experience sudden onset abdominal or back pain.    Thank you for allowing [place] to participate in this patient's care.  Charisse March, RN, MSN, FNP-C [facility] [place] Office: [phone]  Clinic Physician: Imogene Burn  11/10/2013, 9:08 AM

## 2013-11-14 ENCOUNTER — Other Ambulatory Visit: Payer: Medicare Other

## 2013-11-14 ENCOUNTER — Ambulatory Visit: Payer: Medicare Other | Admitting: Neurosurgery

## 2014-11-10 ENCOUNTER — Emergency Department (HOSPITAL_BASED_OUTPATIENT_CLINIC_OR_DEPARTMENT_OTHER)
Admission: EM | Admit: 2014-11-10 | Discharge: 2014-11-10 | Disposition: A | Discharge status: Home / Self Care | Payer: Medicare Other | Attending: Emergency Medicine | Admitting: Emergency Medicine

## 2014-11-10 ENCOUNTER — Encounter (HOSPITAL_BASED_OUTPATIENT_CLINIC_OR_DEPARTMENT_OTHER): Payer: Self-pay

## 2014-11-10 ENCOUNTER — Emergency Department (HOSPITAL_BASED_OUTPATIENT_CLINIC_OR_DEPARTMENT_OTHER): Payer: Medicare Other

## 2014-11-10 DIAGNOSIS — Z7982 Long term (current) use of aspirin: Secondary | ICD-10-CM | POA: Diagnosis not present

## 2014-11-10 DIAGNOSIS — I10 Essential (primary) hypertension: Secondary | ICD-10-CM | POA: Insufficient documentation

## 2014-11-10 DIAGNOSIS — Z79899 Other long term (current) drug therapy: Secondary | ICD-10-CM | POA: Diagnosis not present

## 2014-11-10 DIAGNOSIS — E785 Hyperlipidemia, unspecified: Secondary | ICD-10-CM | POA: Diagnosis not present

## 2014-11-10 DIAGNOSIS — I739 Peripheral vascular disease, unspecified: Secondary | ICD-10-CM | POA: Diagnosis not present

## 2014-11-10 DIAGNOSIS — D529 Folate deficiency anemia, unspecified: Secondary | ICD-10-CM | POA: Diagnosis not present

## 2014-11-10 DIAGNOSIS — Z7902 Long term (current) use of antithrombotics/antiplatelets: Secondary | ICD-10-CM | POA: Insufficient documentation

## 2014-11-10 DIAGNOSIS — G4700 Insomnia, unspecified: Secondary | ICD-10-CM | POA: Insufficient documentation

## 2014-11-10 DIAGNOSIS — Z8673 Personal history of transient ischemic attack (TIA), and cerebral infarction without residual deficits: Secondary | ICD-10-CM | POA: Diagnosis not present

## 2014-11-10 DIAGNOSIS — I2510 Atherosclerotic heart disease of native coronary artery without angina pectoris: Secondary | ICD-10-CM | POA: Diagnosis not present

## 2014-11-10 DIAGNOSIS — M5416 Radiculopathy, lumbar region: Secondary | ICD-10-CM | POA: Diagnosis not present

## 2014-11-10 DIAGNOSIS — M545 Low back pain: Secondary | ICD-10-CM | POA: Diagnosis present

## 2014-11-10 DIAGNOSIS — M549 Dorsalgia, unspecified: Secondary | ICD-10-CM

## 2014-11-10 DIAGNOSIS — Z720 Tobacco use: Secondary | ICD-10-CM | POA: Insufficient documentation

## 2014-11-10 MED ORDER — HYDROCODONE-ACETAMINOPHEN 5-325 MG PO TABS
2.0000 | ORAL_TABLET | Freq: Once | ORAL | Status: AC
  Administered 2014-11-10: 2 via ORAL
  Filled 2014-11-10: qty 2

## 2014-11-10 MED ORDER — HYDROCODONE-ACETAMINOPHEN 5-325 MG PO TABS: 1.0000 | ORAL_TABLET | ORAL | Status: AC | PRN

## 2014-11-10 MED ORDER — METHYLPREDNISOLONE 4 MG PO KIT: PACK | ORAL | Status: AC

## 2014-11-10 NOTE — Discharge Instructions (Signed)
Rest for the next few days on your back, with your hips and knees bent. Follow-up with your physician at your scheduled appointment on Tuesday. You develop sudden loss of bowel or bladder control or the inability to empty her bowel or bladder, or sudden weakness of the right leg, or symptoms to the left leg, then return to the emergency room. This would indicate a sudden acute herniation of a disc  Back Pain, Adult Low back pain is very common. About 1 in 5 people have back pain.The cause of low back pain is rarely dangerous. The pain often gets better over time.About half of people with a sudden onset of back pain feel better in just 2 weeks. About 8 in 10 people feel better by 6 weeks.  CAUSES Some common causes of back pain include:  Strain of the muscles or ligaments supporting the spine.  Wear and tear (degeneration) of the spinal discs.  Arthritis.  Direct injury to the back. DIAGNOSIS Most of the time, the direct cause of low back pain is not known.However, back pain can be treated effectively even when the exact cause of the pain is unknown.Answering your caregiver's questions about your overall health and symptoms is one of the most accurate ways to make sure the cause of your pain is not dangerous. If your caregiver needs more information, he or she may order lab work or imaging tests (X-rays or MRIs).However, even if imaging tests show changes in your back, this usually does not require surgery. HOME CARE INSTRUCTIONS For many people, back pain returns.Since low back pain is rarely dangerous, it is often a condition that people can learn to [facility] their own.   Remain active. It is stressful on the back to sit or stand in one place. Do not sit, drive, or stand in one place for more than 30 minutes at a time. Take short walks on level surfaces as soon as pain allows.Try to increase the length of time you walk each day.  Do not stay in bed.Resting more than 1 or 2 days can  delay your recovery.  Do not avoid exercise or work.Your body is made to move.It is not dangerous to be active, even though your back may hurt.Your back will likely heal faster if you return to being active before your pain is gone.  Pay attention to your body when you bend and lift. Many people have less discomfortwhen lifting if they bend their knees, keep the load close to their bodies,and avoid twisting. Often, the most comfortable positions are those that put less stress on your recovering back.  Find a comfortable position to sleep. Use a firm mattress and lie on your side with your knees slightly bent. If you lie on your back, put a pillow under your knees.  Only take over-the-counter or prescription medicines as directed by your caregiver. Over-the-counter medicines to reduce pain and inflammation are often the most helpful.Your caregiver may prescribe muscle relaxant drugs.These medicines help dull your pain so you can more quickly return to your normal activities and healthy exercise.  Put ice on the injured area.  Put ice in a plastic bag.  Place a towel between your skin and the bag.  Leave the ice on for 15-20 minutes, 03-04 times a day for the first 2 to 3 days. After that, ice and heat may be alternated to reduce pain and spasms.  Ask your caregiver about trying back exercises and gentle massage. This may be of some benefit.  Avoid feeling anxious or stressed.Stress increases muscle tension and can worsen back pain.It is important to recognize when you are anxious or stressed and learn ways to manage it.Exercise is a great option. SEEK MEDICAL CARE IF:  You have pain that is not relieved with rest or medicine.  You have pain that does not improve in 1 week.  You have new symptoms.  You are generally not feeling well. SEEK IMMEDIATE MEDICAL CARE IF:   You have pain that radiates from your back into your legs.  You develop new bowel or bladder control  problems.  You have unusual weakness or numbness in your arms or legs.  You develop nausea or vomiting.  You develop abdominal pain.  You feel faint. Document Released: 11/16/2005 Document Revised: 05/17/2012 Document Reviewed: 03/20/2014 [facility] Patient Information 2015 [place], [place]. This information is not intended to replace advice given to you by your health care provider. Make sure you discuss any questions you have with your health care provider.  Radicular Pain Radicular pain in either the arm or leg is usually from a bulging or herniated disk in the spine. A piece of the herniated disk may press against the nerves as the nerves exit the spine. This causes pain which is felt at the tips of the nerves down the arm or leg. Other causes of radicular pain may include:  Fractures.  Heart disease.  Cancer.  An abnormal and usually degenerative state of the nervous system or nerves (neuropathy). Diagnosis may require CT or MRI scanning to determine the primary cause.  Nerves that start at the neck (nerve roots) may cause radicular pain in the outer shoulder and arm. It can spread down to the thumb and fingers. The symptoms vary depending on which nerve root has been affected. In most cases radicular pain improves with conservative treatment. Neck problems may require physical therapy, a neck collar, or cervical traction. Treatment may take many weeks, and surgery may be considered if the symptoms do not improve.  Conservative treatment is also recommended for sciatica. Sciatica causes pain to radiate from the lower back or buttock area down the leg into the foot. Often there is a history of back problems. Most patients with sciatica are better after 2 to 4 weeks of rest and other supportive care. Short term bed rest can reduce the disk pressure considerably. Sitting, however, is not a good position since this increases the pressure on the disk. You should avoid bending, lifting, and all  other activities which make the problem worse. Traction can be used in severe cases. Surgery is usually reserved for patients who do not improve within the first months of treatment. Only take over-the-counter or prescription medicines for pain, discomfort, or fever as directed by your caregiver. Narcotics and muscle relaxants may help by relieving more severe pain and spasm and by providing mild sedation. Cold or massage can give significant relief. Spinal manipulation is not recommended. It can increase the degree of disc protrusion. Epidural steroid injections are often effective treatment for radicular pain. These injections deliver medicine to the spinal nerve in the space between the protective covering of the spinal cord and back bones (vertebrae). Your caregiver can give you more information about steroid injections. These injections are most effective when given within two weeks of the onset of pain.  You should see your caregiver for follow up care as recommended. A program for neck and back injury rehabilitation with stretching and strengthening exercises is an important part of management.  SEEK IMMEDIATE MEDICAL CARE IF:  You develop increased pain, weakness, or numbness in your arm or leg.  You develop difficulty with bladder or bowel control.  You develop abdominal pain. Document Released: 12/24/2004 Document Revised: 02/08/2012 Document Reviewed: 03/11/2009 [facility] Patient Information 2015 [place], [place]. This information is not intended to replace advice given to you by your health care provider. Make sure you discuss any questions you have with your health care provider.

## 2014-11-10 NOTE — ED Provider Notes (Signed)
CSN: [id number]     Arrival date & time 11/10/14  1002 History   First MD Initiated Contact with Patient 11/10/14 1133     Chief Complaint  Patient presents with  . Back Pain      HPI  Patient presents for evaluation of pain in the right back rating to the right anterior hip. It is worse with any movement. No falls or injuries or trauma. No recent unusual activities that would explain musculo- skeletal pain. No changes in bowel or bladder habits. No fevers or chills. No dysuria.  Past Medical History  Diagnosis Date  . Coronary artery disease   . Hypertension   . Stroke   . Abnormal coagulation profile   . Insomnia, unspecified   . Other chronic pulmonary heart diseases     Pulmonary hypertension  . Anemia     iron Deficiency  . Peripheral arterial disease   . Hyperlipidemia   . Carotid artery occlusion   . AAA (abdominal aortic aneurysm)   . Iliac artery aneurysm, left    History reviewed. No pertinent past surgical history. Family History  Problem Relation Age of Onset  . Heart disease Brother   . Hyperlipidemia Brother   . Hypertension Brother   . Hypertension Mother   . Heart disease Mother     Brain Aneurysm  . Diabetes Mother   . Aneurysm Mother     BRAIN   History  Substance Use Topics  . Smoking status: Current Every Day Smoker -- 50 years    Types: Cigarettes  . Smokeless tobacco: Never Used  . Alcohol Use: 1.0 oz/week    2 drink(s) per week    Review of Systems  Constitutional: Negative for fever, chills, diaphoresis, appetite change and fatigue.  HENT: Negative for mouth sores, sore throat and trouble swallowing.   Eyes: Negative for visual disturbance.  Respiratory: Negative for cough, chest tightness, shortness of breath and wheezing.   Cardiovascular: Negative for chest pain.  Gastrointestinal: Negative for nausea, vomiting, abdominal pain, diarrhea and abdominal distention.  Endocrine: Negative for polydipsia, polyphagia and polyuria.    Genitourinary: Negative for dysuria, frequency and hematuria.  Musculoskeletal: Positive for back pain. Negative for gait problem.  Skin: Negative for color change, pallor and rash.  Neurological: Negative for dizziness, syncope, light-headedness and headaches.  Hematological: Does not bruise/bleed easily.  Psychiatric/Behavioral: Negative for behavioral problems and confusion.      Allergies  Review of patient's allergies indicates no known allergies.  Home Medications   Prior to Admission medications   Medication Sig Start Date End Date Taking? Authorizing Provider  amLODipine (NORVASC) 10 MG tablet Take 10 mg by mouth daily.      Historical Provider, MD  aspirin EC 81 MG tablet Take 81 mg by mouth daily.      Historical Provider, MD  atorvastatin (LIPITOR) 80 MG tablet Take 80 mg by mouth daily.    Historical Provider, MD  carvedilol (COREG) 6.25 MG tablet Take 6.25 mg by mouth 2 (two) times daily with a meal.      Historical Provider, MD  cloNIDine (CATAPRES) 0.2 MG tablet Take 0.2 mg by mouth daily.    Historical Provider, MD  donepezil (ARICEPT) 10 MG tablet Take 10 mg by mouth at bedtime.      Historical Provider, MD  ergocalciferol (VITAMIN D2) 50000 UNITS capsule Take 50,000 Units by mouth once a week. Take on Saturday     Historical Provider, MD  ferrous sulfate 325 (65 FE)  MG tablet Take 325 mg by mouth daily with breakfast.      Historical Provider, MD  folic acid (FOLVITE) 1 MG tablet Take 1 mg by mouth daily.      Historical Provider, MD  hydrALAZINE (APRESOLINE) 25 MG tablet Take 25 mg by mouth 3 (three) times daily. Blood pressure     Historical Provider, MD  irbesartan (AVAPRO) 300 MG tablet Take 300 mg by mouth at bedtime.      Historical Provider, MD  naphazoline (CLEAR EYES) 0.012 % ophthalmic solution Place 2 drops into both eyes 2 (two) times daily. Red itchy eyes     Historical Provider, MD  prasugrel (EFFIENT) 10 MG TABS Take 10 mg by mouth daily.       Historical Provider, MD  sildenafil (VIAGRA) 100 MG tablet Take 100 mg by mouth as needed.      Historical Provider, MD   BP 129/86 mmHg  Pulse 87  Temp(Src) 98.2 F (36.8 C) (Oral)  Resp 18  Ht 5\' 8"  (1.727 m)  Wt 208 lb (94.348 kg)  BMI 31.63 kg/m2  SpO2 98% Physical Exam  Constitutional: He is oriented to person, place, and time. He appears well-developed and well-nourished. No distress.  Patient is sitting on a stool room. Knees at 90 feet on the floor. He states this is fairly comfortable position for him.  HENT:  Head: Normocephalic.  Eyes: Conjunctivae are normal. Pupils are equal, round, and reactive to light. No scleral icterus.  Neck: Normal range of motion. Neck supple. No thyromegaly present.  Cardiovascular: Normal rate and regular rhythm.  Exam reveals no gallop and no friction rub.   No murmur heard. Pulmonary/Chest: Effort normal and breath sounds normal. No respiratory distress. He has no wheezes. He has no rales.  Abdominal: Soft. Bowel sounds are normal. He exhibits no distension. There is no tenderness. There is no rebound.  Musculoskeletal: Normal range of motion.       Back:       Legs: Neurological: He is alert and oriented to person, place, and time.  Normal symmetric Strength to shoulder shrug, triceps, biceps, grip,wrist flex/extend,and intrinsics  Norma lsymmetric sensation above and below clavicles, and to all distributions to UEs. Norma symmetric strength to flex/.extend hip and knees, dorsi/plantar flex ankles. Normal symmetric sensation to all distributions to LEs Patellar and achilles reflexes 1-2+. Downgoing Babinski   Skin: Skin is warm and dry. No rash noted.  Psychiatric: He has a normal mood and affect. His behavior is normal.    ED Course  Procedures (including critical care time) Labs Review Labs Reviewed - No data to display  Imaging Review Dg Lumbar Spine Complete  11/10/2014   CLINICAL DATA:  78 year old male with 1 week history  of lower back pain  EXAM: LUMBAR SPINE - COMPLETE 4+ VIEW  COMPARISON:  None.  FINDINGS: No evidence of acute fracture or malalignment. Multilevel degenerative spurring and noted and knee lumbar spine particularly at L2-L3 and L3-L4. There is mild grade 1 anterolisthesis of L4 on L5 which is likely degenerative in etiology. Is facet arthropathy present at L4-L5 and L5-S1. Atherosclerotic calcifications in the abdominal aorta. Unremarkable visualized bowel gas pattern.  IMPRESSION: 1. Lower lumbar degenerative disc disease and facet arthropathy. 2. No acute fracture or malalignment.   Electronically Signed   By: Malachy MoanHeath  McCullough M.D.   On: 11/10/2014 12:45     EKG Interpretation None      MDM   Final diagnoses:  Back pain  Lumbar radiculopathy    Patient shows no loss of neurological function. He is getting some relief here after by mouth Vicodin. He has intact reflexes and strength. He has reported normal bowel bladder control he has arthritic back on x-ray. Has a follow-up point with his physician in 3 days. No symptoms or findings that would demonstrate need for emergent neuroimaging at this time. His pattern of pain does suggest L1/2 radiculopathy. I think rest and the back safe position with Medrol and close follow-up and return precautions are all that is indicated at this time.    Rolland Porter, MD 11/10/14 [phone]

## 2014-11-10 NOTE — ED Notes (Addendum)
Patient here with lower back pain with radiation to right hip x 3 days, thinks he strained it 3 weeks ago.  Pain worse with any change in position and worse in am

## 2014-11-19 ENCOUNTER — Encounter: Payer: Self-pay | Admitting: Family

## 2014-11-20 ENCOUNTER — Ambulatory Visit (HOSPITAL_COMMUNITY)
Admission: RE | Admit: 2014-11-20 | Discharge: 2014-11-20 | Disposition: A | Discharge status: Home / Self Care | Payer: Medicare Other | Source: Ambulatory Visit | Attending: Family | Admitting: Family

## 2014-11-20 ENCOUNTER — Encounter: Payer: Self-pay | Admitting: Family

## 2014-11-20 ENCOUNTER — Ambulatory Visit (INDEPENDENT_AMBULATORY_CARE_PROVIDER_SITE_OTHER): Payer: Medicare Other | Admitting: Family

## 2014-11-20 VITALS — BP 111/76 | HR 71 | Resp 16 | Ht 68.0 in | Wt 203.0 lb

## 2014-11-20 DIAGNOSIS — I714 Abdominal aortic aneurysm, without rupture, unspecified: Secondary | ICD-10-CM

## 2014-11-20 DIAGNOSIS — I723 Aneurysm of iliac artery: Secondary | ICD-10-CM

## 2014-11-20 NOTE — Progress Notes (Signed)
[facility] [place]  Established Abdominal Aortic Aneurysm  History of Present Illness  Steven Mejia is a 78 y.o. (1936/03/16) male patient of Dr. Arbie Cookey returns today for follow up of known small AAA. Previous studies demonstrate an AAA, measuring 3.2 x 3.2 cm; at that time the left common iliac artery measured 2.6 cm x 2.6 cm. The patient does not have abdominal pain. He developed back pain about November 2015, about 2 weeks in to the back pain it became severe; evaluation of this found a HNP in his lumbar spine per pt.   The patient reports claudication in both calves after walking a mile, relieved by rest, denies non-healing wounds.  He was started on Effient after his stroke about 2011 as manifested by left leg and arm numbness and weakness, slurred speech, confusion, denies monocular blindness; he was treated in an ED at that time with thrombolytic medication. He denies any further stroke or TIA symptoms. He states that his cardiologist is checking [place] of his neck and legs.  He denies history of carotid artery intervention. He denies steal symptoms in either arm. He takes ASA, a statin, but wife states that he does not take any of his medications on a regular basis.   He admits to more than 14 drinks/week.  Pt Diabetic: No Pt smoker: smoker (1 cigarette/day x 60 yrs)    Past Medical History  Diagnosis Date  . Coronary artery disease   . Hypertension   . Stroke   . Abnormal coagulation profile   . Insomnia, unspecified   . Other chronic pulmonary heart diseases     Pulmonary hypertension  . Anemia     iron Deficiency  . Peripheral arterial disease   . Hyperlipidemia   . Carotid artery occlusion   . AAA (abdominal aortic aneurysm)   . Iliac artery aneurysm, left    No past surgical history on file. Social History History   Social History  . Marital Status: Married    Spouse Name: N/A    Number of Children: N/A  . Years of Education: N/A    Occupational History  . Not on file.   Social History Main Topics  . Smoking status: Current Every Day Smoker -- 50 years    Types: Cigarettes  . Smokeless tobacco: Never Used  . Alcohol Use: 1.0 oz/week    2 drink(s) per week  . Drug Use: No  . Sexual Activity: No   Other Topics Concern  . Not on file   Social History Narrative   Family History Family History  Problem Relation Age of Onset  . Heart disease Brother   . Hyperlipidemia Brother   . Hypertension Brother   . Hypertension Mother   . Heart disease Mother     Brain Aneurysm  . Diabetes Mother   . Aneurysm Mother     BRAIN    Current Outpatient Prescriptions on File Prior to Visit  Medication Sig Dispense Refill  . amLODipine (NORVASC) 10 MG tablet Take 10 mg by mouth daily.      Marland Kitchen aspirin EC 81 MG tablet Take 81 mg by mouth daily.      Marland Kitchen atorvastatin (LIPITOR) 80 MG tablet Take 80 mg by mouth daily.    . carvedilol (COREG) 6.25 MG tablet Take 6.25 mg by mouth 2 (two) times daily with a meal.      . cloNIDine (CATAPRES) 0.2 MG tablet Take 0.2 mg by mouth daily.    Marland Kitchen donepezil (  ARICEPT) 10 MG tablet Take 10 mg by mouth at bedtime.      . ergocalciferol (VITAMIN D2) 50000 UNITS capsule Take 50,000 Units by mouth once a week. Take on Saturday     . ferrous sulfate 325 (65 FE) MG tablet Take 325 mg by mouth daily with breakfast.      . folic acid (FOLVITE) 1 MG tablet Take 1 mg by mouth daily.      . hydrALAZINE (APRESOLINE) 25 MG tablet Take 25 mg by mouth 3 (three) times daily. Blood pressure     . HYDROcodone-acetaminophen (NORCO/VICODIN) 5-325 MG per tablet Take 1 tablet by mouth every 4 (four) hours as needed. 30 tablet 0  . irbesartan (AVAPRO) 300 MG tablet Take 300 mg by mouth at bedtime.      . methylPREDNISolone (MEDROL DOSEPAK) 4 MG tablet X 6 days, as directed. 21 tablet 0  . naphazoline (CLEAR EYES) 0.012 % ophthalmic solution Place 2 drops into both eyes 2 (two) times daily. Red itchy eyes     .  prasugrel (EFFIENT) 10 MG TABS Take 10 mg by mouth daily.      . sildenafil (VIAGRA) 100 MG tablet Take 100 mg by mouth as needed.       No current facility-administered medications on file prior to visit.   No Known Allergies  ROS: See HPI for pertinent positives and negatives.  Physical Examination  Filed Vitals:   11/20/14 0939  BP: 111/76  Pulse: 71  Resp: 16  Height: 5\' 8"  (1.727 m)  Weight: 203 lb (92.08 kg)  SpO2: 98%   Body mass index is 30.87 kg/(m^2).  General: A&O x 3, WD, obese male.  Pulmonary: Sym exp, good air movt, CTAB, no rales, rhonchi, or wheezing.  Cardiac: RRR, Nl S1, S2.  Carotid Bruits Left Right   Negative Negative   Radial pulses: right is 2+, left is not palpable, left brachial pulse is 3+ Aorta is not palpable   VASCULAR EXAM:   Feet are warm, appear well perfused   LE Pulses LEFT RIGHT       FEMORAL 2+ palpable 2+ palpable   POPLITEAL not palpable  not palpable       PT Not palpable Not palpable       DP Not palpable Not palpable    Gastrointestinal: soft, NTND, -G/R, - HSM, - palpable masses, - CVAT B.  Musculoskeletal: M/S 5/5 throughout.  Neurologic: CN 2-12 intact, Pain and light touch intact in extremities, Motor exam as listed above.    Non-Invasive Vascular Imaging  AAA Duplex (11/20/2014) ABDOMINAL AORTA DUPLEX EVALUATION    INDICATION: Evaluation of abdominal aorta.    PREVIOUS INTERVENTION(S):     DUPLEX EXAM:     LOCATION DIAMETER AP (cm) DIAMETER TRANSVERSE (cm) VELOCITIES (cm/sec)  Aorta Proximal 2.24 2.07 56  Aorta Mid 2.49 2.44 110  Aorta Distal 2.46 2.54 81  Right Common Iliac Artery Not Visualized  Not Visualized    Left Common Iliac Artery 1.61 1.56     Previous max aortic diameter:  3.3 x 3.2 Date: 11/10/2013     ADDITIONAL  FINDINGS: Technically difficult exam due to overlying bowel gas and patient body habitus.    IMPRESSION: Aneurysmal dilatation of the abdominal aorta with a maximum diameter of 2.46 x 2.54 cm, based on limited visualization as described above. Moderate plaque noted in the proximal to mid aorta.    Compared to the previous exam:  Not valid due to limited visualization.  Medical Decision Making  The patient is a 78 y.o. male who presents with asymptomatic historically small AAA with no increase in size, however this Duplex is not valid for comparison due to limited visualization. Unfortunately he continues to smoke and he uses ETOH to excess. The patient was counseled re smoking cessation and given several free resources re smoking cessation.  His wife reports that pt sporadically takes all of his medications; pt advised to take all of his medications as prescribed and to discuss this with his PCP ASAP.   Based on this patient's exam and diagnostic studies, the patient will follow up in 1 year  with the following studies: AAA Duplex .  Consideration for repair of AAA would be made when the size approaches 4.8 or 5.0 cm, growth > 1 cm/yr, and symptomatic status.  I emphasized the importance of maximal medical management including strict control of blood pressure, blood glucose, and lipid levels, antiplatelet agents, obtaining regular exercise, and cessation of smoking.   The patient was given information about AAA including signs, symptoms, treatment, and how to minimize the risk of enlargement and rupture of aneurysms.    The patient was advised to call 911 should the patient experience sudden onset abdominal or back pain.   Thank you for allowing us to participate in this patient's care.  Charisse MarchSuzanne Nickel, RN, MSN, FNP-C [facility] [place] Office: [phone]  Clinic Physician: Early  11/20/2014, 9:35 AM

## 2014-11-20 NOTE — Patient Instructions (Addendum)
Abdominal Aortic Aneurysm An aneurysm is a weakened or damaged part of an artery wall that bulges from the normal force of blood pumping through the body. An abdominal aortic aneurysm is an aneurysm that occurs in the lower part of the aorta, the main artery of the body.  The major concern with an abdominal aortic aneurysm is that it can enlarge and burst (rupture) or blood can flow between the layers of the wall of the aorta through a tear (aorticdissection). Both of these conditions can cause bleeding inside the body and can be life threatening unless diagnosed and treated promptly. CAUSES  The exact cause of an abdominal aortic aneurysm is unknown. Some contributing factors are:   A hardening of the arteries caused by the buildup of fat and other substances in the lining of a blood vessel (arteriosclerosis).  Inflammation of the walls of an artery (arteritis).   Connective tissue diseases, such as Marfan syndrome.   Abdominal trauma.   An infection, such as syphilis or staphylococcus, in the wall of the aorta (infectious aortitis) caused by bacteria. RISK FACTORS  Risk factors that contribute to an abdominal aortic aneurysm may include:  Age older than 60 years.   High blood pressure (hypertension).  Male gender.  Ethnicity (white race).  Obesity.  Family history of aneurysm (first degree relatives only).  Tobacco use. PREVENTION  The following healthy lifestyle habits may help decrease your risk of abdominal aortic aneurysm:  Quitting smoking. Smoking can raise your blood pressure and cause arteriosclerosis.  Limiting or avoiding alcohol.  Keeping your blood pressure, blood sugar level, and cholesterol levels within normal limits.  Decreasing your salt intake. In somepeople, too much salt can raise blood pressure and increase your risk of abdominal aortic aneurysm.  Eating a diet low in saturated fats and cholesterol.  Increasing your fiber intake by including  whole grains, vegetables, and fruits in your diet. Eating these foods may help lower blood pressure.  Maintaining a healthy weight.  Staying physically active and exercising regularly. SYMPTOMS  The symptoms of abdominal aortic aneurysm may vary depending on the size and rate of growth of the aneurysm.Most grow slowly and do not have any symptoms. When symptoms do occur, they may include:  Pain (abdomen, side, lower back, or groin). The pain may vary in intensity. A sudden onset of severe pain may indicate that the aneurysm has ruptured.  Feeling full after eating only small amounts of food.  Nausea or vomiting or both.  Feeling a pulsating lump in the abdomen.  Feeling faint or passing out. DIAGNOSIS  Since most unruptured abdominal aortic aneurysms have no symptoms, they are often discovered during diagnostic exams for other conditions. An aneurysm may be found during the following procedures:  Ultrasonography (A one-time screening for abdominal aortic aneurysm by ultrasonography is also recommended for all men aged 65-75 years who have ever smoked).  X-ray exams.  A computed tomography (CT).  Magnetic resonance imaging (MRI).  Angiography or arteriography. TREATMENT  Treatment of an abdominal aortic aneurysm depends on the size of your aneurysm, your age, and risk factors for rupture. Medication to control blood pressure and pain may be used to manage aneurysms smaller than 6 cm. Regular monitoring for enlargement may be recommended by your caregiver if:  The aneurysm is 3-4 cm in size (an annual ultrasonography may be recommended).  The aneurysm is 4-4.5 cm in size (an ultrasonography every 6 months may be recommended).  The aneurysm is larger than 4.5 cm in   size (your caregiver may ask that you be examined by a vascular surgeon). If your aneurysm is larger than 6 cm, surgical repair may be recommended. There are two main methods for repair of an aneurysm:   Endovascular  repair (a minimally invasive surgery). This is done most often.  Open repair. This method is used if an endovascular repair is not possible. Document Released: 08/26/2005 Document Revised: 03/13/2013 Document Reviewed: 12/16/2012 [employer] Patient Information 2015 [employer]. This information is not intended to replace advice given to you by your health care provider. Make sure you discuss any questions you have with your health care provider.   Smoking Cessation Quitting smoking is important to your health and has many advantages. However, it is not always easy to quit since nicotine is a very addictive drug. Oftentimes, people try 3 times or more before being able to quit. This document explains the best ways for you to prepare to quit smoking. Quitting takes hard work and a lot of effort, but you can do it. ADVANTAGES OF QUITTING SMOKING  You will live longer, feel better, and live better.  Your body will feel the impact of quitting smoking almost immediately.  Within 20 minutes, blood pressure decreases. Your pulse returns to its normal level.  After 8 hours, carbon monoxide levels in the blood return to normal. Your oxygen level increases.  After 24 hours, the chance of having a heart attack starts to decrease. Your breath, hair, and body stop smelling like smoke.  After 48 hours, damaged nerve endings begin to recover. Your sense of taste and smell improve.  After 72 hours, the body is virtually free of nicotine. Your bronchial tubes relax and breathing becomes easier.  After 2 to 12 weeks, lungs can hold more air. Exercise becomes easier and circulation improves.  The risk of having a heart attack, stroke, cancer, or lung disease is greatly reduced.  After 1 year, the risk of coronary heart disease is cut in half.  After 5 years, the risk of stroke falls to the same as a nonsmoker.  After 10 years, the risk of lung cancer is cut in half and the risk of other cancers  decreases significantly.  After 15 years, the risk of coronary heart disease drops, usually to the level of a nonsmoker.  If you are pregnant, quitting smoking will improve your chances of having a healthy baby.  The people you live with, especially any children, will be healthier.  You will have extra money to spend on things other than cigarettes. QUESTIONS TO THINK ABOUT BEFORE ATTEMPTING TO QUIT You may want to talk about your answers with your health care provider.  Why do you want to quit?  If you tried to quit in the past, what helped and what did not?  What will be the most difficult situations for you after you quit? How will you plan to handle them?  Who can help you through the tough times? Your family? Friends? A health care provider?  What pleasures do you get from smoking? What ways can you still get pleasure if you quit? Here are some questions to ask your health care provider:  How can you help me to be successful at quitting?  What medicine do you think would be best for me and how should I take it?  What should I do if I need more help?  What is smoking withdrawal like? How can I get information on withdrawal? GET READY  Set a quit   date.  Change your environment by getting rid of all cigarettes, ashtrays, matches, and lighters in your home, car, or work. Do not let people smoke in your home.  Review your past attempts to quit. Think about what worked and what did not. GET SUPPORT AND ENCOURAGEMENT You have a better chance of being successful if you have help. You can get support in many ways.  Tell your family, friends, and coworkers that you are going to quit and need their support. Ask them not to smoke around you.  Get individual, group, or telephone counseling and support. Programs are available at local hospitals and health centers. Call your local health department for information about programs in your area.  Spiritual beliefs and practices may  help some smokers quit.  Download a "quit meter" on your computer to keep track of quit statistics, such as how long you have gone without smoking, cigarettes not smoked, and money saved.  Get a self-help book about quitting smoking and staying off tobacco. LEARN NEW SKILLS AND BEHAVIORS  Distract yourself from urges to smoke. Talk to someone, go for a walk, or occupy your time with a task.  Change your normal routine. Take a different route to work. Drink tea instead of coffee. Eat breakfast in a different place.  Reduce your stress. Take a hot bath, exercise, or read a book.  Plan something enjoyable to do every day. Reward yourself for not smoking.  Explore interactive web-based programs that specialize in helping you quit. GET MEDICINE AND USE IT CORRECTLY Medicines can help you stop smoking and decrease the urge to smoke. Combining medicine with the above behavioral methods and support can greatly increase your chances of successfully quitting smoking.  Nicotine replacement therapy helps deliver nicotine to your body without the negative effects and risks of smoking. Nicotine replacement therapy includes nicotine gum, lozenges, inhalers, nasal sprays, and skin patches. Some may be available over-the-counter and others require a prescription.  Antidepressant medicine helps people abstain from smoking, but how this works is unknown. This medicine is available by prescription.  Nicotinic receptor partial agonist medicine simulates the effect of nicotine in your brain. This medicine is available by prescription. Ask your health care provider for advice about which medicines to use and how to use them based on your health history. Your health care provider will tell you what side effects to look out for if you choose to be on a medicine or therapy. Carefully read the information on the package. Do not use any other product containing nicotine while using a nicotine replacement product.    RELAPSE OR DIFFICULT SITUATIONS Most relapses occur within the first 3 months after quitting. Do not be discouraged if you start smoking again. Remember, most people try several times before finally quitting. You may have symptoms of withdrawal because your body is used to nicotine. You may crave cigarettes, be irritable, feel very hungry, cough often, get headaches, or have difficulty concentrating. The withdrawal symptoms are only temporary. They are strongest when you first quit, but they will go away within 10-14 days. To reduce the chances of relapse, try to:  Avoid drinking alcohol. Drinking lowers your chances of successfully quitting.  Reduce the amount of caffeine you consume. Once you quit smoking, the amount of caffeine in your body increases and can give you symptoms, such as a rapid heartbeat, sweating, and anxiety.  Avoid smokers because they can make you want to smoke.  Do not let weight gain distract you. Many   smokers will gain weight when they quit, usually less than 10 pounds. Eat a healthy diet and stay active. You can always lose the weight gained after you quit.  Find ways to improve your mood other than smoking. FOR MORE INFORMATION  www.smokefree.gov  Document Released: 11/10/2001 Document Revised: 04/02/2014 Document Reviewed: 02/25/2012 [employer] Patient Information 2015 [employer]. This information is not intended to replace advice given to you by your health care provider. Make sure you discuss any questions you have with your health care provider.   Smoking Cessation, Tips for Success If you are ready to quit smoking, congratulations! You have chosen to help yourself be healthier. Cigarettes bring nicotine, tar, carbon monoxide, and other irritants into your body. Your lungs, heart, and blood vessels will be able to work better without these poisons. There are many different ways to quit smoking. Nicotine gum, nicotine patches, a nicotine inhaler, or nicotine  nasal spray can help with physical craving. Hypnosis, support groups, and medicines help break the habit of smoking. WHAT THINGS CAN I DO TO MAKE QUITTING EASIER?  Here are some tips to help you quit for good:  Pick a date when you will quit smoking completely. Tell all of your friends and family about your plan to quit on that date.  Do not try to slowly cut down on the number of cigarettes you are smoking. Pick a quit date and quit smoking completely starting on that day.  Throw away all cigarettes.   Clean and remove all ashtrays from your home, work, and car.  On a card, write down your reasons for quitting. Carry the card with you and read it when you get the urge to smoke.  Cleanse your body of nicotine. Drink enough water and fluids to keep your urine clear or pale yellow. Do this after quitting to flush the nicotine from your body.  Learn to predict your moods. Do not let a bad situation be your excuse to have a cigarette. Some situations in your life might tempt you into wanting a cigarette.  Never have "just one" cigarette. It leads to wanting another and another. Remind yourself of your decision to quit.  Change habits associated with smoking. If you smoked while driving or when feeling stressed, try other activities to replace smoking. Stand up when drinking your coffee. Brush your teeth after eating. Sit in a different chair when you read the paper. Avoid alcohol while trying to quit, and try to drink fewer caffeinated beverages. Alcohol and caffeine may urge you to smoke.  Avoid foods and drinks that can trigger a desire to smoke, such as sugary or spicy foods and alcohol.  Ask people who smoke not to smoke around you.  Have something planned to do right after eating or having a cup of coffee. For example, plan to take a walk or exercise.  Try a relaxation exercise to calm you down and decrease your stress. Remember, you may be tense and nervous for the first 2 weeks after  you quit, but this will pass.  Find new activities to keep your hands busy. Play with a pen, coin, or rubber band. Doodle or draw things on paper.  Brush your teeth right after eating. This will help cut down on the craving for the taste of tobacco after meals. You can also try mouthwash.   Use oral substitutes in place of cigarettes. Try using lemon drops, carrots, cinnamon sticks, or chewing gum. Keep them handy so they are available when you   have the urge to smoke.  When you have the urge to smoke, try deep breathing.  Designate your home as a nonsmoking area.  If you are a heavy smoker, ask your health care provider about a prescription for nicotine chewing gum. It can ease your withdrawal from nicotine.  Reward yourself. Set aside the cigarette money you save and buy yourself something nice.  Look for support from others. Join a support group or smoking cessation program. Ask someone at home or at work to help you with your plan to quit smoking.  Always ask yourself, "Do I need this cigarette or is this just a reflex?" Tell yourself, "Today, I choose not to smoke," or "I do not want to smoke." You are reminding yourself of your decision to quit.  Do not replace cigarette smoking with electronic cigarettes (commonly called e-cigarettes). The safety of e-cigarettes is unknown, and some may contain harmful chemicals.  If you relapse, do not give up! Plan ahead and think about what you will do the next time you get the urge to smoke. HOW WILL I FEEL WHEN I QUIT SMOKING? You may have symptoms of withdrawal because your body is used to nicotine (the addictive substance in cigarettes). You may crave cigarettes, be irritable, feel very hungry, cough often, get headaches, or have difficulty concentrating. The withdrawal symptoms are only temporary. They are strongest when you first quit but will go away within 10-14 days. When withdrawal symptoms occur, stay in control. Think about your reasons  for quitting. Remind yourself that these are signs that your body is healing and getting used to being without cigarettes. Remember that withdrawal symptoms are easier to treat than the major diseases that smoking can cause.  Even after the withdrawal is over, expect periodic urges to smoke. However, these cravings are generally short lived and will go away whether you smoke or not. Do not smoke! WHAT RESOURCES ARE AVAILABLE TO HELP ME QUIT SMOKING? Your health care provider can direct you to community resources or hospitals for support, which may include:  Group support.  Education.  Hypnosis.  Therapy. Document Released: 08/14/2004 Document Revised: 04/02/2014 Document Reviewed: 05/04/2013 [facility] Patient Information 2015 [place], [place]. This information is not intended to replace advice given to you by your health care provider. Make sure you discuss any questions you have with your health care provider.    How Much is Too Much Alcohol? Drinking too much alcohol can cause injury, accidents, and health problems. These types of problems can include:   Car crashes.  Falls.  Family fighting (domestic violence).  Drowning.  Fights.  Injuries.  Burns.  Damage to certain organs.  Having a baby with birth defects. ONE DRINK CAN BE TOO MUCH WHEN YOU ARE:  Working.  Pregnant or breastfeeding.  Taking medicines. Ask your doctor.  Driving or planning to drive. WHAT IS A STANDARD DRINK?   1 regular beer (12 ounces or 360 milliliters).  1 glass of wine (5 ounces or 150 milliliters).  1 shot of liquor (1.5 ounces or 45 milliliters). BLOOD ALCOHOL LEVELS   .00 A person is sober.  Marland Kitchen.03 A person has no trouble keeping balance, talking, or seeing right, but a "buzz" may be felt.  Marland Kitchen.05 A person feels "buzzed" and relaxed.  Marland Kitchen.08 or .10  A person is drunk. He or she has trouble talking, seeing right, and keeping his or her balance.  .15 A person loses body control and may  pass out (blackout).  .20 A  person has trouble walking (staggering) and throws up (vomits).  .30 A person will pass out (unconscious).  .40+ A person will be in a coma. Death is possible. If you or someone you know has a drinking problem, get help from a doctor.  Document Released: 09/12/2009 Document Revised: 02/08/2012 Document Reviewed: 09/12/2009 [facility] IncExitCare Patient Information 2015 [place], [place]. This information is not intended to replace advice given to you by your health care provider. Make sure you discuss any questions you have with your health care provider.

## 2014-11-21 ENCOUNTER — Other Ambulatory Visit: Payer: Self-pay

## 2014-11-21 DIAGNOSIS — I714 Abdominal aortic aneurysm, without rupture, unspecified: Secondary | ICD-10-CM

## 2015-09-28 IMAGING — CR DG LUMBAR SPINE COMPLETE 4+V
5 series · 5 of 5 positions shown · non-contrast
Comparison: None.

CLINICAL DATA: 78-year-old male with 1 week history of lower back
pain

EXAM:
LUMBAR SPINE - COMPLETE 4+ VIEW

[t l-spine a.p.]
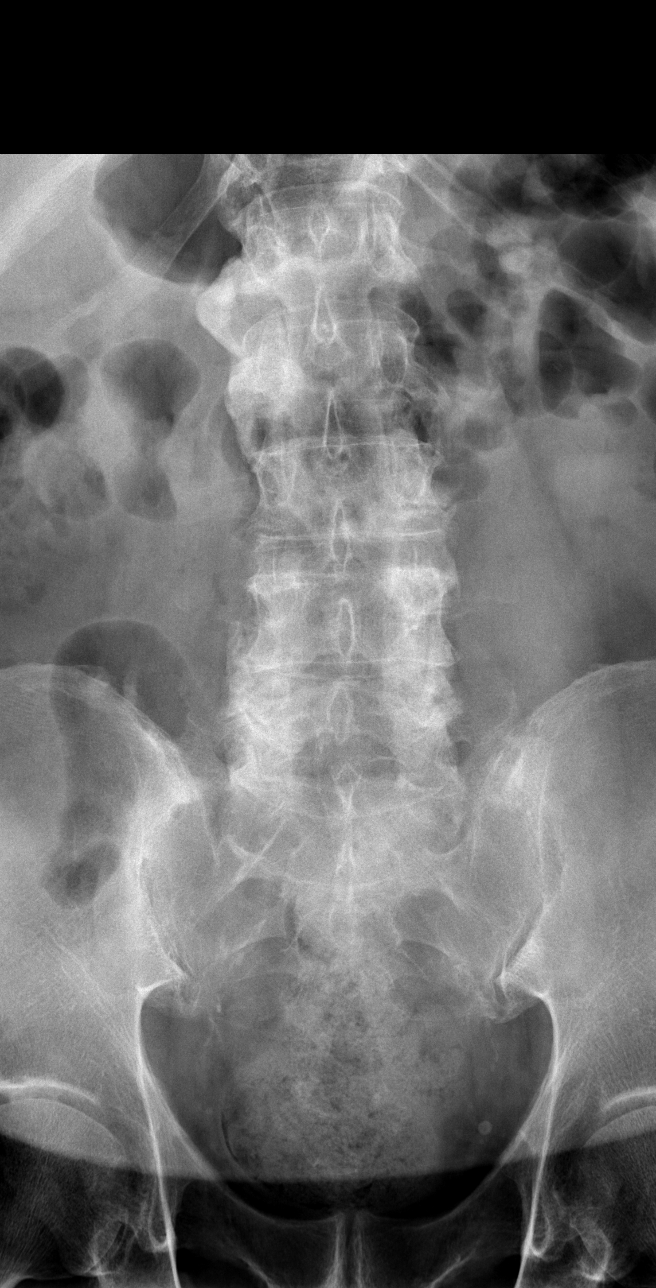

[t l-spine oblique exposure (1 of 2)]
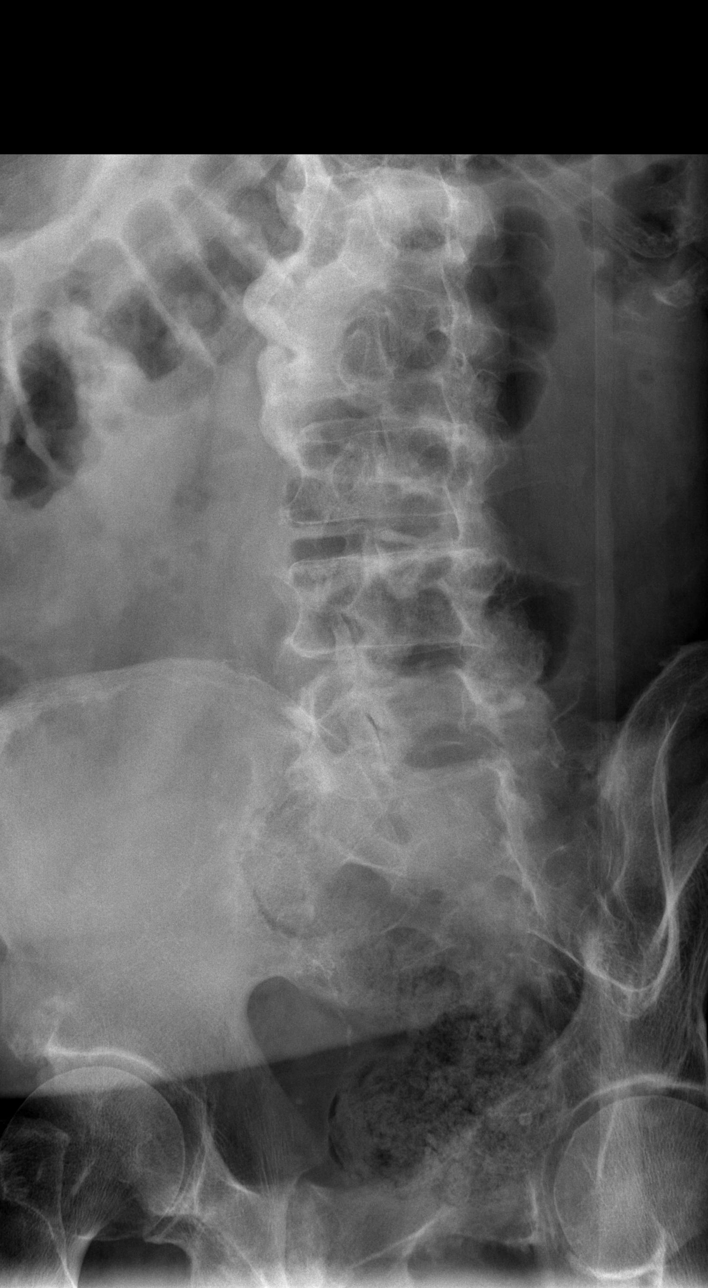

[t l-spine oblique exposure (2 of 2)]
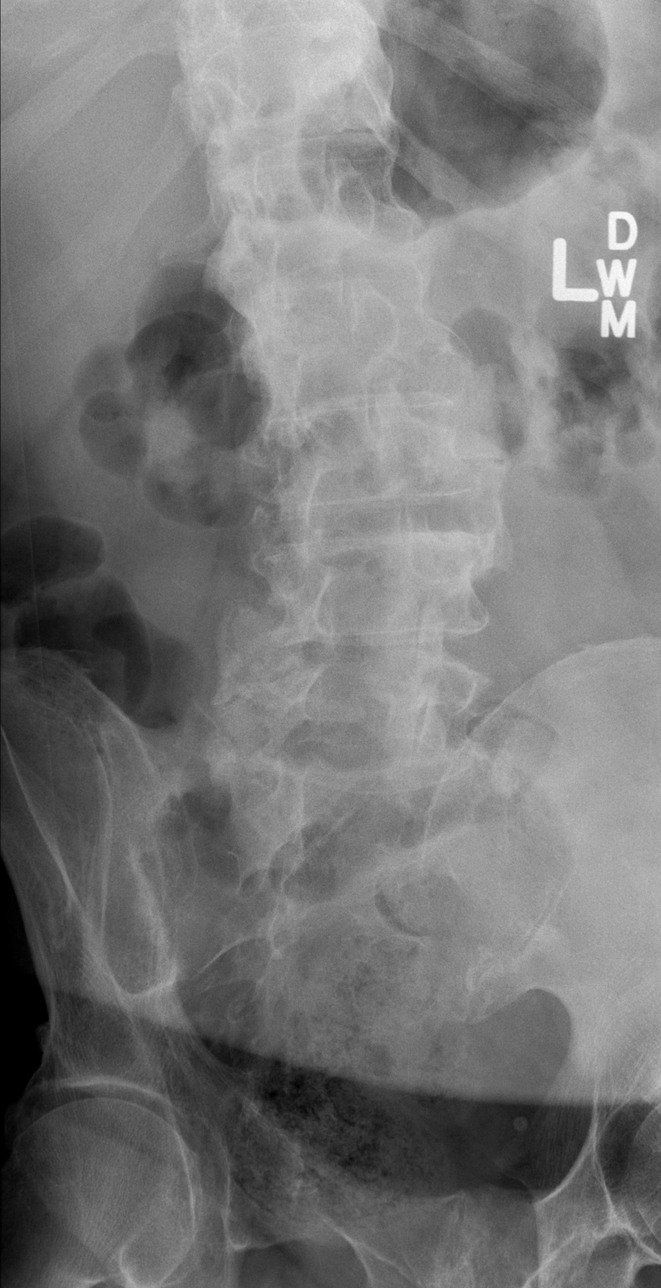

[t l-spine lat]
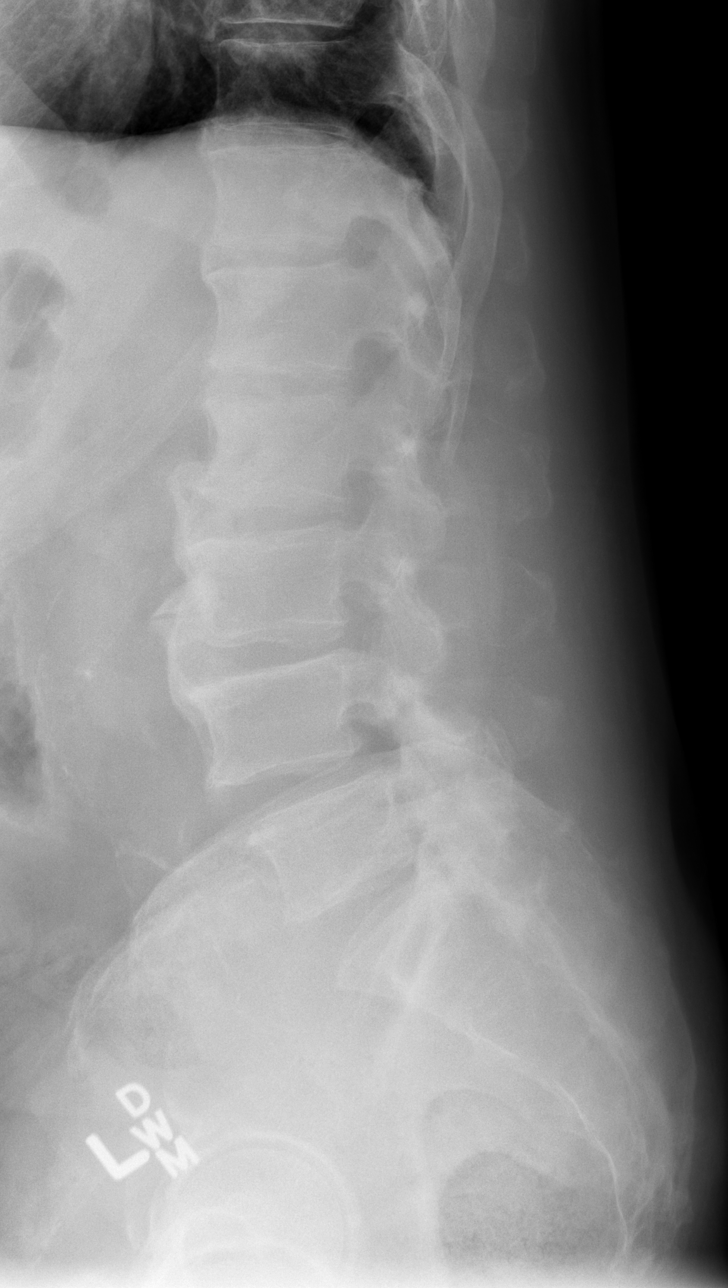

[t l-spine l5-s1 spot]
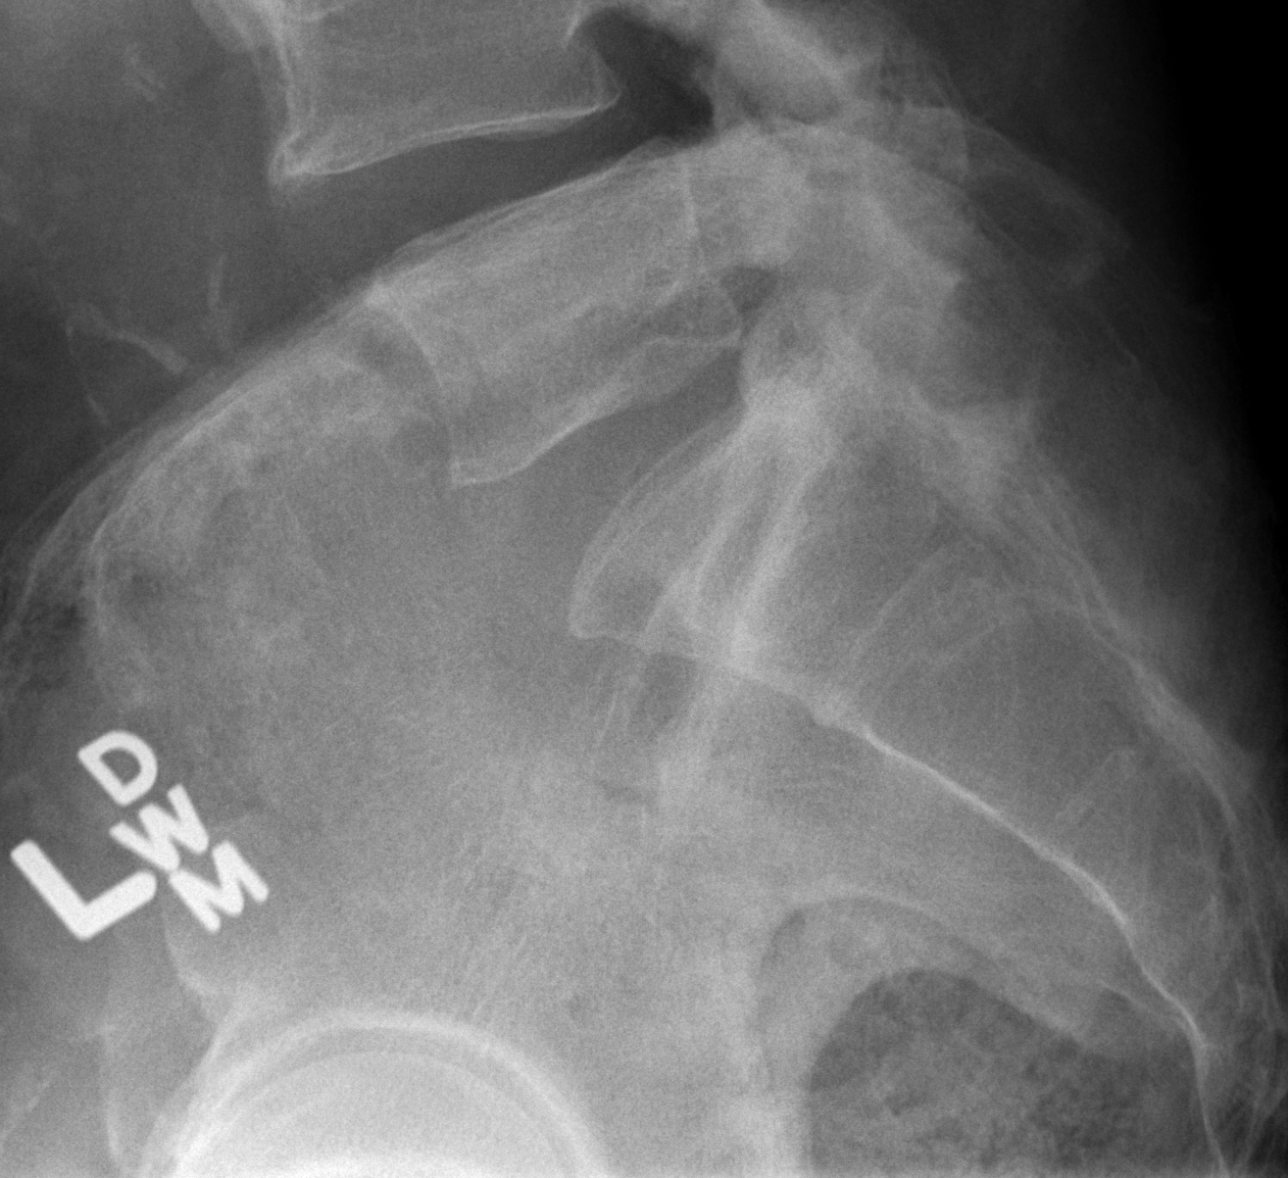

[5 of 5 positions shown; findings below may reference images not displayed]

FINDINGS: No evidence of acute fracture or malalignment. Multilevel
degenerative spurring and noted and knee lumbar spine particularly
at L2-L3 and L3-L4. There is mild grade 1 anterolisthesis of L4 on
L5 which is likely degenerative in etiology. Is facet arthropathy
present at L4-L5 and L5-S1. Atherosclerotic calcifications in the
abdominal aorta. Unremarkable visualized bowel gas pattern.
IMPRESSION: 1. Lower lumbar degenerative disc disease and facet arthropathy.
2. No acute fracture or malalignment.

## 2015-11-26 ENCOUNTER — Ambulatory Visit: Payer: Medicare Other | Admitting: Family

## 2015-11-26 ENCOUNTER — Other Ambulatory Visit (HOSPITAL_COMMUNITY): Payer: Medicare Other

## 2016-02-29 DEATH — deceased
